# Patient Record
Sex: Male | Born: 1970
Health system: Southern US, Community
[De-identification: ages and names within clinical notes are randomized; demographics above are authoritative.]

## PROBLEM LIST (undated history)

## (undated) DIAGNOSIS — K219 Gastro-esophageal reflux disease without esophagitis: Secondary | ICD-10-CM

## (undated) DIAGNOSIS — K5090 Crohn's disease, unspecified, without complications: Secondary | ICD-10-CM

## (undated) DIAGNOSIS — D229 Melanocytic nevi, unspecified: Secondary | ICD-10-CM

## (undated) DIAGNOSIS — E785 Hyperlipidemia, unspecified: Secondary | ICD-10-CM

## (undated) DIAGNOSIS — I10 Essential (primary) hypertension: Secondary | ICD-10-CM

## (undated) HISTORY — PX: OTHER SURGICAL HISTORY: SHX169

## (undated) HISTORY — PX: TONSILLECTOMY: SUR1361

## (undated) HISTORY — PX: ESOPHAGOGASTRODUODENOSCOPY: SHX1529

---
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
## 2007-11-22 ENCOUNTER — Ambulatory Visit: Payer: Self-pay | Admitting: Gastroenterology

## 2007-12-10 ENCOUNTER — Ambulatory Visit: Payer: Self-pay | Admitting: Gastroenterology

## 2008-06-16 ENCOUNTER — Ambulatory Visit: Payer: Self-pay | Admitting: Gastroenterology

## 2009-09-17 ENCOUNTER — Ambulatory Visit: Payer: Self-pay | Admitting: Gastroenterology

## 2009-10-29 ENCOUNTER — Ambulatory Visit: Payer: Self-pay | Admitting: Gastroenterology

## 2009-12-06 ENCOUNTER — Other Ambulatory Visit: Payer: Self-pay | Admitting: Gastroenterology

## 2010-01-10 ENCOUNTER — Ambulatory Visit: Payer: Self-pay | Admitting: Gastroenterology

## 2010-03-28 ENCOUNTER — Other Ambulatory Visit: Payer: Self-pay | Admitting: Nurse Practitioner

## 2010-10-27 ENCOUNTER — Ambulatory Visit: Payer: Self-pay | Admitting: Gastroenterology

## 2010-10-31 LAB — PATHOLOGY REPORT

## 2011-03-17 ENCOUNTER — Ambulatory Visit: Payer: Self-pay | Admitting: Gastroenterology

## 2011-07-19 ENCOUNTER — Other Ambulatory Visit: Payer: Self-pay | Admitting: Gastroenterology

## 2011-08-10 ENCOUNTER — Other Ambulatory Visit: Payer: Self-pay | Admitting: Gastroenterology

## 2011-09-20 ENCOUNTER — Ambulatory Visit: Payer: Self-pay | Admitting: Gastroenterology

## 2011-09-27 ENCOUNTER — Ambulatory Visit: Payer: Self-pay | Admitting: Gastroenterology

## 2011-10-24 ENCOUNTER — Other Ambulatory Visit: Payer: Self-pay | Admitting: Gastroenterology

## 2011-12-13 ENCOUNTER — Other Ambulatory Visit: Payer: Self-pay | Admitting: Gastroenterology

## 2011-12-21 ENCOUNTER — Other Ambulatory Visit: Payer: Self-pay | Admitting: Gastroenterology

## 2012-02-16 ENCOUNTER — Other Ambulatory Visit: Payer: Self-pay | Admitting: Gastroenterology

## 2012-02-16 LAB — CLOSTRIDIUM DIFFICILE BY PCR

## 2012-09-27 ENCOUNTER — Emergency Department: Payer: Self-pay | Admitting: Emergency Medicine

## 2012-09-27 LAB — APTT: Activated PTT: 31.7 secs (ref 23.6–35.9)

## 2012-09-27 LAB — LIPASE, BLOOD: Lipase: 161 U/L (ref 73–393)

## 2012-09-27 LAB — PROTIME-INR: INR: 0.9

## 2013-11-26 IMAGING — CR DG CHEST 2V
1 series · 2 of 2 positions shown · non-contrast
Comparison: none

REASON FOR EXAM: cough
COMMENTS:

[Series 1: w chest pa · 0.14mm/px · 2 of 2 slices shown]
[im 1/2]
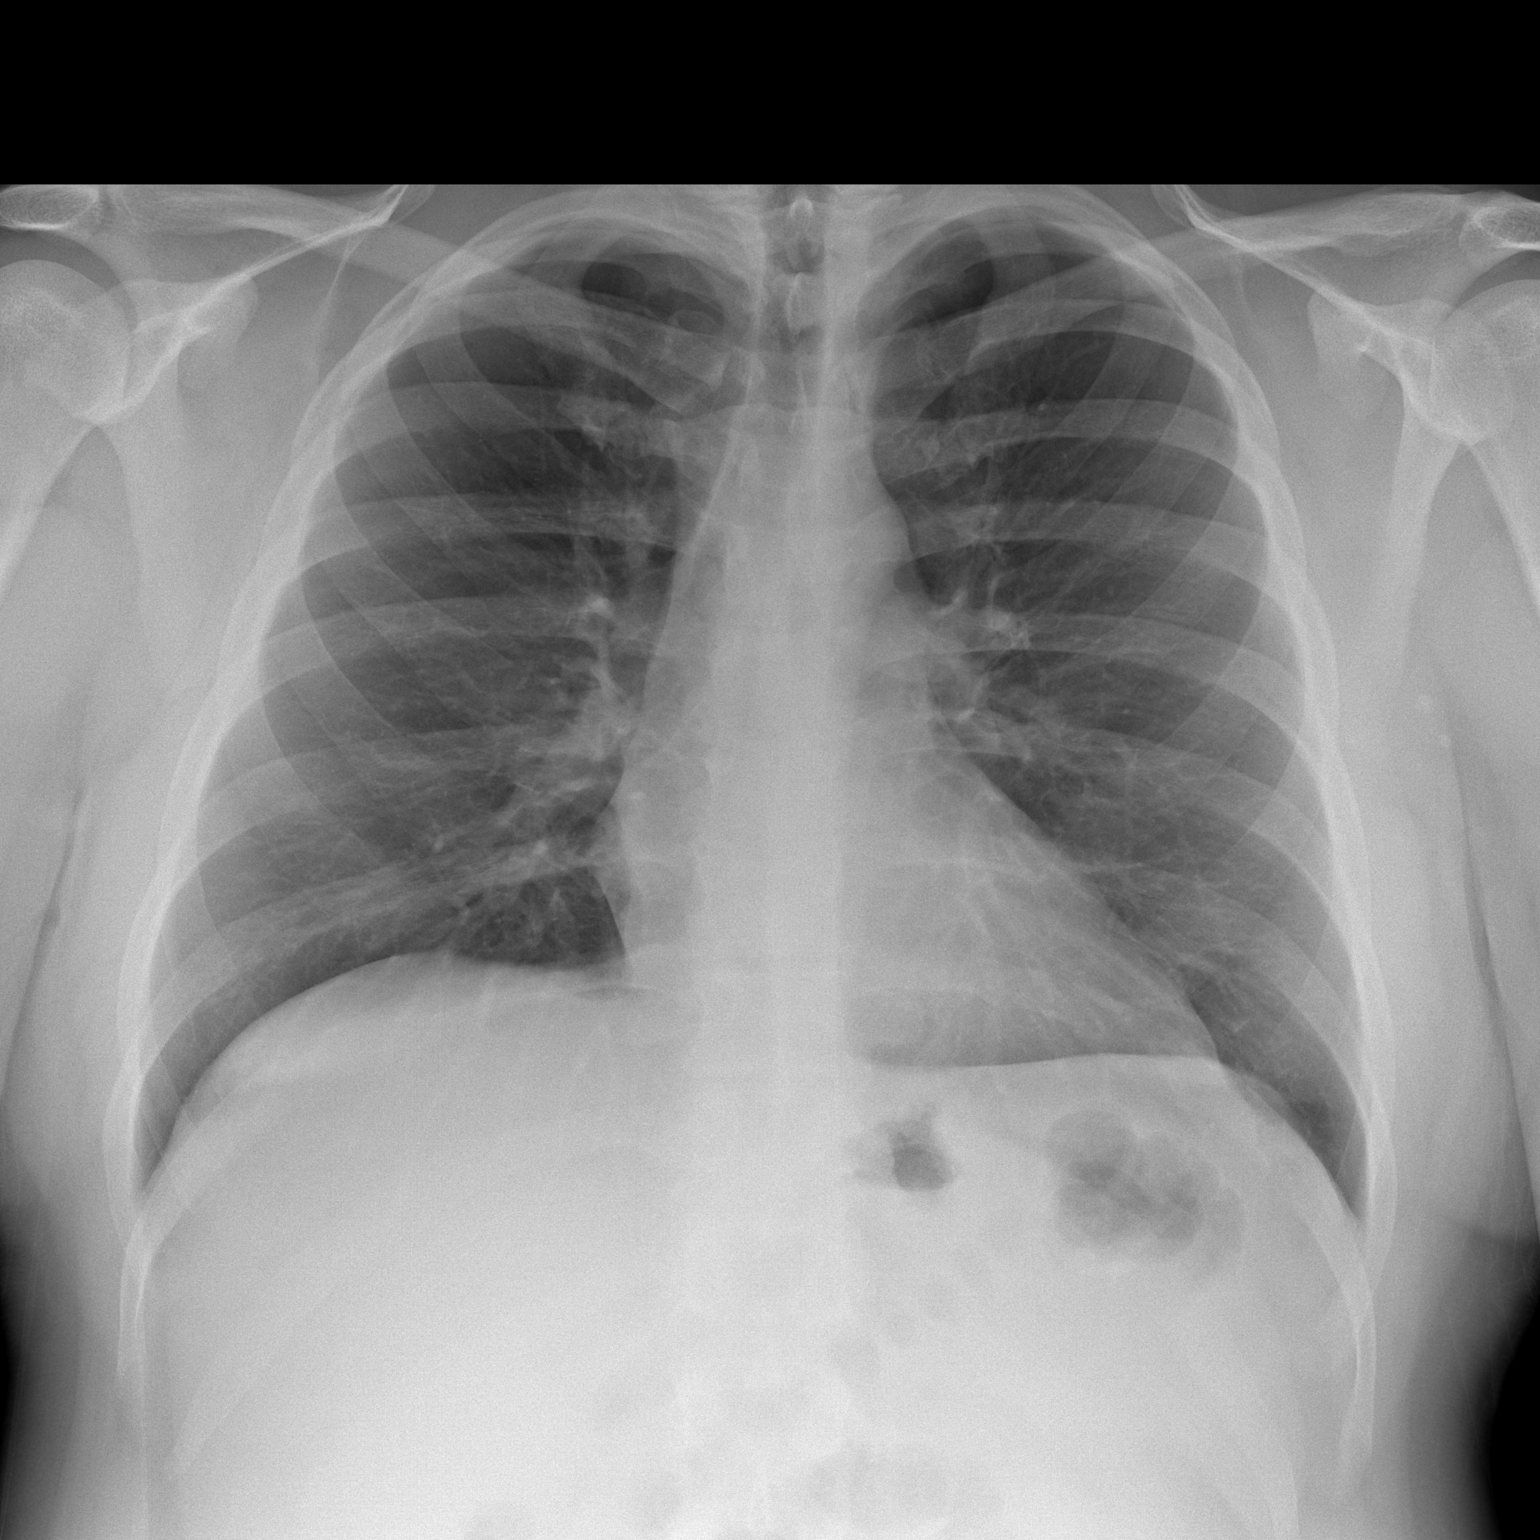
[im 2/2]
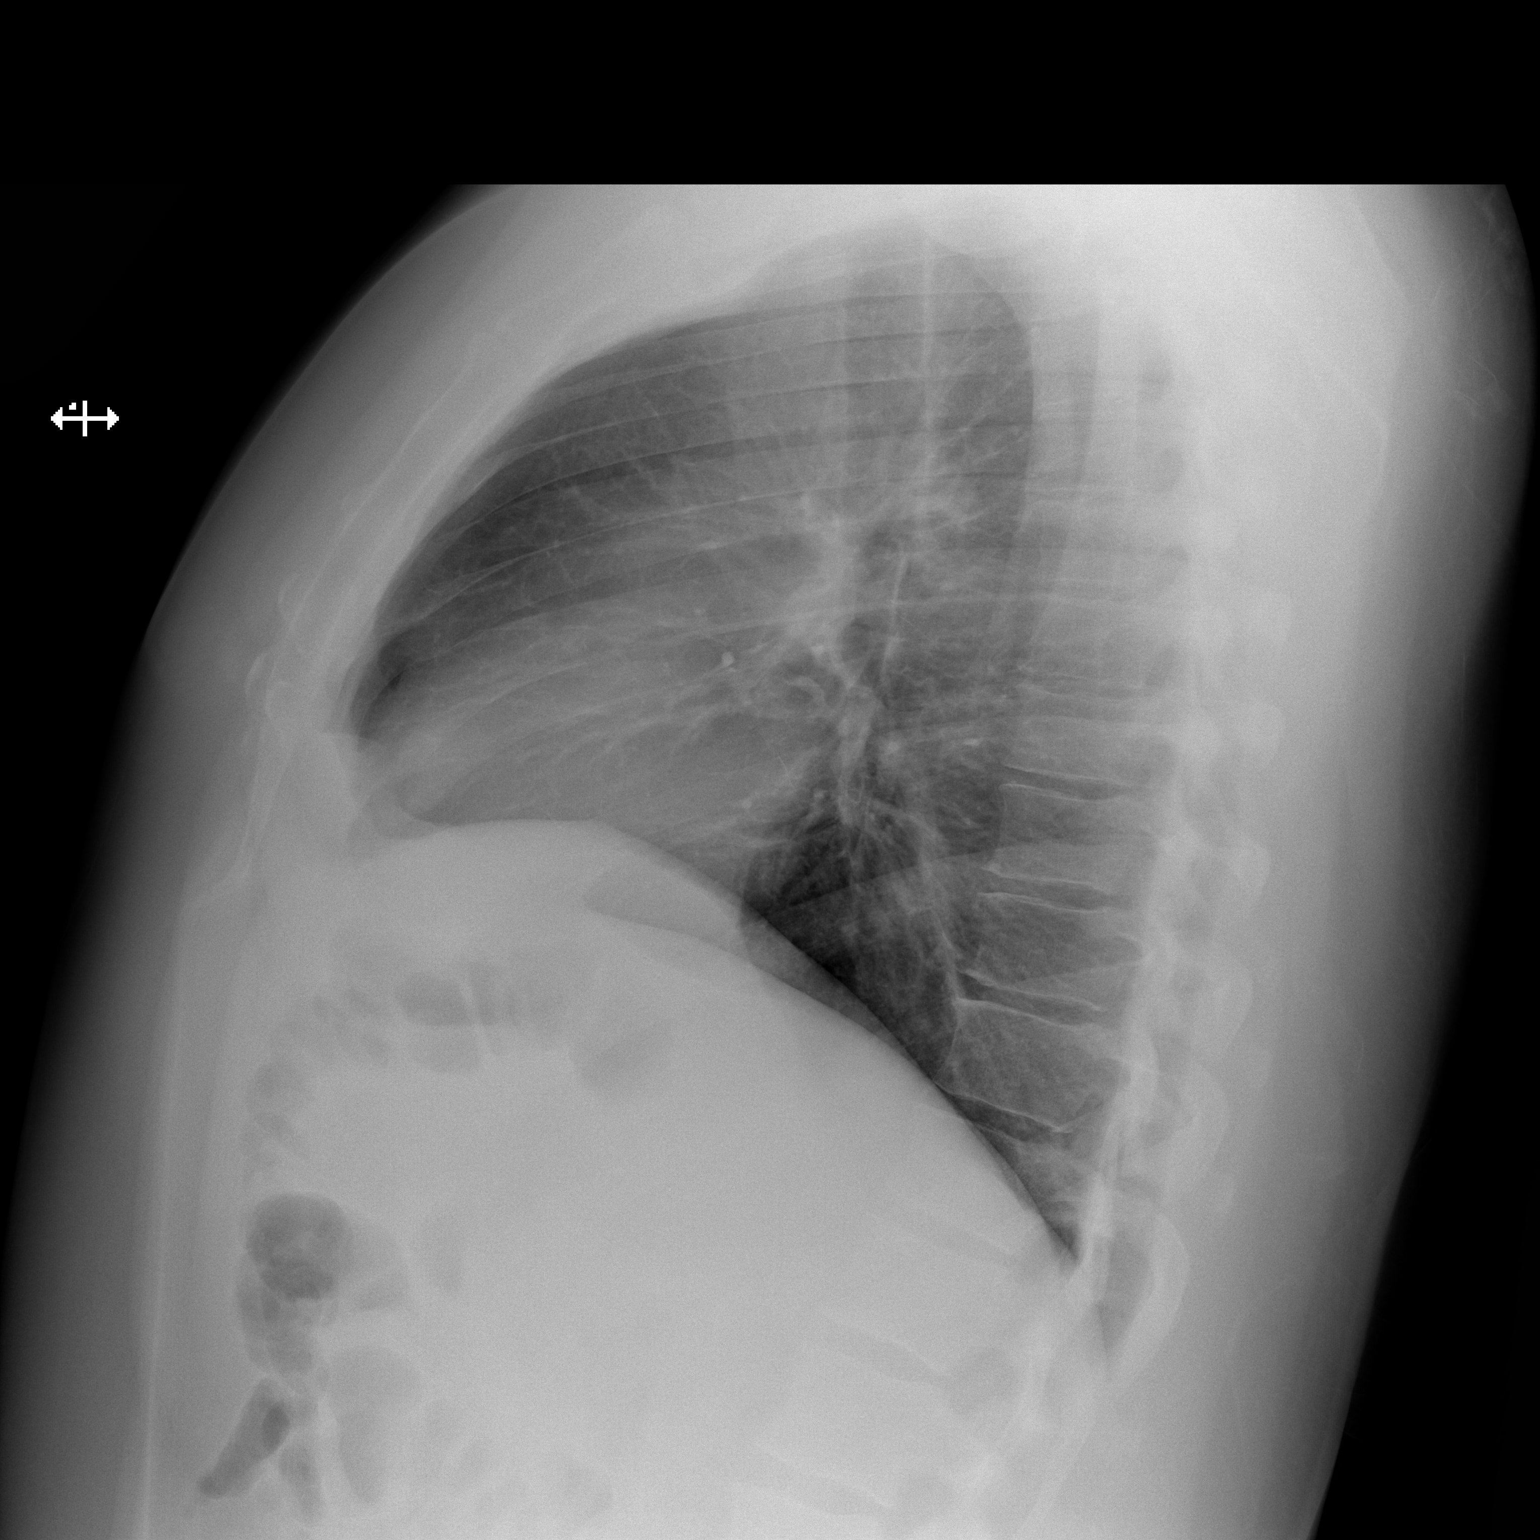

[2 of 2 positions shown; findings below may reference images not displayed]

PROCEDURE:     DXR - DXR CHEST PA (OR AP) AND LATERAL  - September 27, 2012 [DATE]

RESULT:     There is no previous exam for comparison.

The lungs are clear. The heart and pulmonary vessels are normal. The bony
and mediastinal structures are unremarkable. There is no effusion. There is
no pneumothorax or evidence of congestive failure.
IMPRESSION: No acute cardiopulmonary disease.

[REDACTED]

## 2014-06-02 DIAGNOSIS — K219 Gastro-esophageal reflux disease without esophagitis: Secondary | ICD-10-CM | POA: Insufficient documentation

## 2014-06-02 DIAGNOSIS — K5090 Crohn's disease, unspecified, without complications: Secondary | ICD-10-CM | POA: Insufficient documentation

## 2014-07-03 ENCOUNTER — Ambulatory Visit: Payer: Self-pay | Admitting: Gastroenterology

## 2014-07-08 LAB — PATHOLOGY REPORT

## 2015-04-06 NOTE — Consult Note (Signed)
PATIENT NAME:  Todd Shepherd, FLUEGEL MR#:  [id number] DATE OF BIRTH:  11-Nov-1971  DATE OF CONSULTATION:  09/27/2012  REFERRING PHYSICIAN:   CONSULTING PHYSICIAN: Loistine Simas, MD / Theodore Demark, NP  PRIMARY CARE PHYSICIAN: Ramonita Lab, MD  HISTORY OF PRESENT ILLNESS: [name] is a very pleasant 44 year old Caucasian man with a history of Crohn's disease that is currently being treated with Humira 40 mg every week, 6-MP 150 mg p.o. daily, and Entocort 6 mg p.o. daily. Gastroenterology has been consulted at the request of Dr. Loletta Specter, the ED physician, to evaluate elevation of liver transaminases and bilirubin. The patient reports history of intermittent nausea, congestion and cough for the last month. He thought he had possibly picked up an infection from his children. Two weeks ago he saw his primary physician and was started on nasal spray and a Z-Pak without improvement. After a week of therapy, he then began with some nausea and vomiting last Friday as well as fatigue. This persisted throughout the weekend. He phoned his primary physician on Monday who reportedly prescribed him some Levaquin and some Phenergan for his symptoms. He did continue with the nausea and vomiting, noticed his urine turned dark on Tuesday, and both he and his wife report that he developed some jaundice on Wednesday to his eyes and his torso. States his last dose of Levaquin was yesterday. Today he is feeling much better, says his urine is lightening up, his appetite has come back, and he is very hungry. He does deny abdominal pain. States his Crohn's disease and bowel movements have never been better. Denies clay dark-colored stools. Denies history of liver illness in the past. States he has had no other recent medication changes. He has not had any recent alcohol and has never been a heavy drinker. He has not been taking any herbs or increased use of Tylenol. He does not take any illicits and there is no family history  of liver disease. He has never had jaundice in the past. He denies ascites, mentation changes, weakness, confusion, drowsiness, problems with bleeding or bruising, or having had viral hepatitis in the past, and additionally with his Humira therapy he has been screened for hepatitis B and was found to be negative. States that he has had no blood or body fluid exposures. Does eat at some restaurants but has not had any seafood. Additionally, he denies diarrhea and further GI complaints. Noted his lab work with normal white blood cell count, normal hemoglobin, normal platelets, and normal PT- INR. Noted the total bilirubin of 9.9 with a direct bilirubin of 6.6, normal lipase, normal creatinine, AST 120, ALT 106, and alkaline phosphatase is normal 84. Albumin is normal at 3.6. Glucose is normal. Potassium is normal. He did have abdominal ultrasound today that did not show any gallstones or abnormalities to the gallbladder. The liver was normal size. There was normal portal venous flow. There was no intrahepatic or extrahepatic ductal dilation. Again, currently he states he is feeling much better today than he has been over the last few days and the last month.   PAST MEDICAL HISTORY:  1. Crohn's disease. 2. Hypertension. 3. Hyperlipidemia. 4. Tonsillectomy and adenoidectomy.  MEDICATIONS: 1. Bystolic 10 mg p.o. daily.  2. Fenofibrate 160 mg p.o. daily.  3. Multivitamin one cap p.o. daily.  4. Humira 40 mg sub-Q weekly. 5. Protonix 40 mg 1 tab p.o. daily.  6. 6-MP 150 mg p.o. daily.  7. Budesonide 6 mg p.o. daily.  ALLERGIES: Amoxicillin.  LABS AND IMAGING STUDIES: As noted above.   FAMILY HISTORY: Negative for colorectal cancer, liver disease, inflammatory bowel disease, and peptic ulcer disease. Pertinent for diabetes and rheumatoid arthritis.   SOCIAL HISTORY: Stopped smoking in 2008. No EtOH or illicits. Never had a blood transfusion. Lives with family and is in a good relationship.  REVIEW  OF SYSTEMS: Ten systems reviewed. Unremarkable other than that which is noted above.   PHYSICAL EXAMINATION:   VITAL SIGNS: Most recent vital signs: Temperature 98.3, pulse 73, blood pressure 116/56, oxygen saturation 96% on room air, and respiratory rate 18.   GENERAL: Well-appearing Caucasian man lying in bed in no acute distress, appears comfortable.   HEENT: Normocephalic, atraumatic. There is some mild icterus to the sclerae. No redness, drainage, or inflammation to the eyes or the nares. Oral mucous membranes are pink and moist.   NECK: No abnormalities.   PULMONARY: Respirations symmetrical and equal without increased work of breathing. Lungs CTAB.   CARDIOVASCULAR: S1 and S2, regular rate and rhythm. No MRG. No appreciable edema.   ABDOMEN: Protuberant, nondistended. Bowel sounds x4. Very soft. No tenderness, no distention, no ascites, no guarding or rigidity, no peritoneal signs, and no hepatosplenomegaly, bruits, hernias, or other abnormalities.   RECTAL: Deferred.   GENITOURINARY: Deferred.   EXTREMITIES: Moves all extremities x4. Strength 5/5. Sensation appears intact. No clubbing or cyanosis. No bruising. No petechiae. No erythema, lesion or rash.   SKIN: Warm and dry with minimal jaundice.  NEUROLOGICAL: Alert and oriented x3. Cranial nerves II through XII grossly intact. Speech clear. No facial droop.   PSYCHIATRIC: Pleasant, cooperative, good memory skills.   IMPRESSION AND PLAN: Mild jaundice, elevated bilirubin, and slight transaminitis. Questionably drug reaction, less likely viral episode, feeling better, has improved. No alarm signs are present such as altered mentation, abdominal pain, or bleeding. With the exception of his bilirubin, AST, and ALT his lab work.  I have discussed him with Dr. Gustavo Lah  and since he is feeling much better at present we will not change anything and we will let him go home from the Emergency Department on the condition that if he feels  worse over the weekend he should return and he should also additionally schedule an appointment in the clinic to be seen Monday morning and we will do further work-up from there. He should also avoid any additional substances that may affect his liver.   These services were provided by Stephens November, MSN, [place] in collaboration with Loistine Simas, MD.   ____________________________ Theodore Demark, NP chl:slb D: 09/27/2012 15:17:02 ET T: 09/27/2012 15:39:10 ET JOB#: [id number]  cc: Theodore Demark, NP, <Dictator> [place] SIGNED 10/05/2012 7:47

## 2016-11-03 DIAGNOSIS — D229 Melanocytic nevi, unspecified: Secondary | ICD-10-CM

## 2016-11-03 HISTORY — DX: Melanocytic nevi, unspecified: D22.9

## 2017-01-11 DIAGNOSIS — K5090 Crohn's disease, unspecified, without complications: Secondary | ICD-10-CM | POA: Diagnosis not present

## 2017-01-11 DIAGNOSIS — Z125 Encounter for screening for malignant neoplasm of prostate: Secondary | ICD-10-CM | POA: Diagnosis not present

## 2017-01-11 DIAGNOSIS — I10 Essential (primary) hypertension: Secondary | ICD-10-CM | POA: Diagnosis not present

## 2017-01-18 DIAGNOSIS — Z0001 Encounter for general adult medical examination with abnormal findings: Secondary | ICD-10-CM | POA: Diagnosis not present

## 2017-01-18 DIAGNOSIS — K219 Gastro-esophageal reflux disease without esophagitis: Secondary | ICD-10-CM | POA: Diagnosis not present

## 2017-01-18 DIAGNOSIS — I10 Essential (primary) hypertension: Secondary | ICD-10-CM | POA: Diagnosis not present

## 2017-02-19 DIAGNOSIS — D229 Melanocytic nevi, unspecified: Secondary | ICD-10-CM | POA: Diagnosis not present

## 2017-02-19 DIAGNOSIS — L570 Actinic keratosis: Secondary | ICD-10-CM | POA: Diagnosis not present

## 2017-02-20 DIAGNOSIS — I10 Essential (primary) hypertension: Secondary | ICD-10-CM | POA: Diagnosis not present

## 2017-05-21 DIAGNOSIS — D485 Neoplasm of uncertain behavior of skin: Secondary | ICD-10-CM | POA: Diagnosis not present

## 2017-06-05 DIAGNOSIS — K5010 Crohn's disease of large intestine without complications: Secondary | ICD-10-CM | POA: Diagnosis not present

## 2017-06-05 DIAGNOSIS — Z86010 Personal history of colonic polyps: Secondary | ICD-10-CM | POA: Diagnosis not present

## 2017-06-29 ENCOUNTER — Encounter: Payer: Self-pay | Admitting: *Deleted

## 2017-07-02 ENCOUNTER — Ambulatory Visit: Payer: 59 | Admitting: Anesthesiology

## 2017-07-02 ENCOUNTER — Ambulatory Visit
Admission: RE | Admit: 2017-07-02 | Discharge: 2017-07-02 | Disposition: A | Discharge status: Home / Self Care | Payer: 59 | Source: Ambulatory Visit | Attending: Gastroenterology | Admitting: Gastroenterology

## 2017-07-02 ENCOUNTER — Encounter
Admission: RE | Disposition: A | Discharge status: Home / Self Care | Payer: Self-pay | Source: Ambulatory Visit | Attending: Gastroenterology

## 2017-07-02 DIAGNOSIS — Z8719 Personal history of other diseases of the digestive system: Secondary | ICD-10-CM | POA: Insufficient documentation

## 2017-07-02 DIAGNOSIS — D120 Benign neoplasm of cecum: Secondary | ICD-10-CM | POA: Insufficient documentation

## 2017-07-02 DIAGNOSIS — K5190 Ulcerative colitis, unspecified, without complications: Secondary | ICD-10-CM | POA: Insufficient documentation

## 2017-07-02 DIAGNOSIS — I10 Essential (primary) hypertension: Secondary | ICD-10-CM | POA: Insufficient documentation

## 2017-07-02 DIAGNOSIS — Z79899 Other long term (current) drug therapy: Secondary | ICD-10-CM | POA: Diagnosis not present

## 2017-07-02 DIAGNOSIS — Z87891 Personal history of nicotine dependence: Secondary | ICD-10-CM | POA: Insufficient documentation

## 2017-07-02 DIAGNOSIS — K219 Gastro-esophageal reflux disease without esophagitis: Secondary | ICD-10-CM | POA: Diagnosis not present

## 2017-07-02 DIAGNOSIS — K5289 Other specified noninfective gastroenteritis and colitis: Secondary | ICD-10-CM | POA: Diagnosis not present

## 2017-07-02 DIAGNOSIS — K5090 Crohn's disease, unspecified, without complications: Secondary | ICD-10-CM | POA: Diagnosis present

## 2017-07-02 DIAGNOSIS — K633 Ulcer of intestine: Secondary | ICD-10-CM | POA: Diagnosis not present

## 2017-07-02 DIAGNOSIS — K529 Noninfective gastroenteritis and colitis, unspecified: Secondary | ICD-10-CM | POA: Insufficient documentation

## 2017-07-02 DIAGNOSIS — K5790 Diverticulosis of intestine, part unspecified, without perforation or abscess without bleeding: Secondary | ICD-10-CM | POA: Diagnosis not present

## 2017-07-02 DIAGNOSIS — K5010 Crohn's disease of large intestine without complications: Secondary | ICD-10-CM | POA: Diagnosis not present

## 2017-07-02 DIAGNOSIS — Z86010 Personal history of colonic polyps: Secondary | ICD-10-CM | POA: Diagnosis not present

## 2017-07-02 DIAGNOSIS — K635 Polyp of colon: Secondary | ICD-10-CM | POA: Diagnosis not present

## 2017-07-02 HISTORY — DX: Crohn's disease, unspecified, without complications: K50.90

## 2017-07-02 HISTORY — DX: Essential (primary) hypertension: I10

## 2017-07-02 HISTORY — DX: Gastro-esophageal reflux disease without esophagitis: K21.9

## 2017-07-02 HISTORY — PX: COLONOSCOPY WITH PROPOFOL: SHX5780

## 2017-07-02 HISTORY — DX: Hyperlipidemia, unspecified: E78.5

## 2017-07-02 SURGERY — COLONOSCOPY WITH PROPOFOL
Anesthesia: General

## 2017-07-02 MED ORDER — PROPOFOL 10 MG/ML IV BOLUS
INTRAVENOUS | Status: DC | PRN
  Administered 2017-07-02: 70 mg via INTRAVENOUS
  Administered 2017-07-02 (×2): 30 mg via INTRAVENOUS

## 2017-07-02 MED ORDER — PROPOFOL 10 MG/ML IV BOLUS
INTRAVENOUS | Status: AC
  Filled 2017-07-02: qty 20

## 2017-07-02 MED ORDER — EPHEDRINE SULFATE 50 MG/ML IJ SOLN
INTRAMUSCULAR | Status: AC
  Filled 2017-07-02: qty 1

## 2017-07-02 MED ORDER — PROPOFOL 500 MG/50ML IV EMUL
INTRAVENOUS | Status: AC
  Filled 2017-07-02: qty 50

## 2017-07-02 MED ORDER — SODIUM CHLORIDE 0.9 % IV SOLN
INTRAVENOUS | Status: DC
  Administered 2017-07-02: 07:00:00 via INTRAVENOUS

## 2017-07-02 MED ORDER — SODIUM CHLORIDE 0.9 % IV SOLN: INTRAVENOUS | Status: DC

## 2017-07-02 MED ORDER — PROPOFOL 500 MG/50ML IV EMUL
INTRAVENOUS | Status: DC | PRN
  Administered 2017-07-02: 150 ug/kg/min via INTRAVENOUS

## 2017-07-02 MED ORDER — GLYCOPYRROLATE 0.2 MG/ML IJ SOLN
INTRAMUSCULAR | Status: AC
  Filled 2017-07-02: qty 1

## 2017-07-02 MED ORDER — LIDOCAINE HCL (CARDIAC) 20 MG/ML IV SOLN
INTRAVENOUS | Status: DC | PRN
  Administered 2017-07-02: 100 mg via INTRAVENOUS

## 2017-07-02 MED ORDER — SUCCINYLCHOLINE CHLORIDE 20 MG/ML IJ SOLN
INTRAMUSCULAR | Status: AC
  Filled 2017-07-02: qty 1

## 2017-07-02 MED ORDER — PHENYLEPHRINE HCL 10 MG/ML IJ SOLN
INTRAMUSCULAR | Status: AC
  Filled 2017-07-02: qty 1

## 2017-07-02 MED ORDER — LIDOCAINE HCL (PF) 2 % IJ SOLN
INTRAMUSCULAR | Status: AC
  Filled 2017-07-02: qty 2

## 2017-07-02 NOTE — Anesthesia Post-op Follow-up Note (Cosign Needed)
Anesthesia QCDR form completed.        

## 2017-07-02 NOTE — Transfer of Care (Signed)
Immediate Anesthesia Transfer of Care Note  Patient: [name]  Procedure(s) Performed: Procedure(s): COLONOSCOPY WITH PROPOFOL (N/A)  Patient Location: PACU  Anesthesia Type:General  Level of Consciousness: awake, alert  and oriented  Airway & Oxygen Therapy: Patient Spontanous Breathing and Patient connected to nasal cannula oxygen  Post-op Assessment: Report given to RN and Post -op Vital signs reviewed and stable  Post vital signs: Reviewed and stable  Last Vitals:  Vitals:   07/02/17 0710 07/02/17 0813  BP: 125/69 90/70  Pulse: 63 71  Resp:  18  Temp: (!) 35.7 C (!) 36.2 C    Last Pain:  Vitals:   07/02/17 0813  TempSrc: Tympanic         Complications: No apparent anesthesia complications

## 2017-07-02 NOTE — Anesthesia Preprocedure Evaluation (Signed)
Anesthesia Evaluation  Patient identified by MRN, date of birth, ID band Patient awake    Reviewed: Allergy & Precautions, NPO status , Patient's Chart, lab work & pertinent test results, reviewed documented beta blocker date and time   Airway Mallampati: II  TM Distance: >3 FB     Dental  (+) Chipped   Pulmonary former smoker,           Cardiovascular hypertension, Pt. on medications      Neuro/Psych    GI/Hepatic GERD  ,  Endo/Other    Renal/GU      Musculoskeletal   Abdominal   Peds  Hematology   Anesthesia Other Findings   Reproductive/Obstetrics                             Anesthesia Physical Anesthesia Plan  ASA: II  Anesthesia Plan: General   Post-op Pain Management:    Induction: Intravenous  PONV Risk Score and Plan:   Airway Management Planned:   Additional Equipment:   Intra-op Plan:   Post-operative Plan:   Informed Consent: I have reviewed the patients History and Physical, chart, labs and discussed the procedure including the risks, benefits and alternatives for the proposed anesthesia with the patient or authorized representative who has indicated his/her understanding and acceptance.     Plan Discussed with: CRNA  Anesthesia Plan Comments:         Anesthesia Quick Evaluation

## 2017-07-02 NOTE — Anesthesia Postprocedure Evaluation (Signed)
Anesthesia Post Note  Patient: Todd Shepherd  Procedure(s) Performed: Procedure(s) (LRB): COLONOSCOPY WITH PROPOFOL (N/A)  Patient location during evaluation: Endoscopy Anesthesia Type: General Level of consciousness: awake and alert Pain management: pain level controlled Vital Signs Assessment: post-procedure vital signs reviewed and stable Respiratory status: spontaneous breathing, nonlabored ventilation, respiratory function stable and patient connected to nasal cannula oxygen Cardiovascular status: blood pressure returned to baseline and stable Postop Assessment: no signs of nausea or vomiting Anesthetic complications: no     Last Vitals:  Vitals:   07/02/17 0833 07/02/17 0843  BP: 121/79 124/77  Pulse: 69 (!) 55  Resp: 19 (!) 6  Temp:      Last Pain:  Vitals:   07/02/17 0813  TempSrc: Tympanic                 Charina Fons S

## 2017-07-02 NOTE — H&P (Signed)
Outpatient short stay form Pre-procedure 07/02/2017 7:33 AM Lollie Sails MD  Primary Physician: Dr. Ramonita Lab  Reason for visit:  Colonoscopy  History of present illness:  Patient is a 46 year old male with a personal history of Crohn's disease. He is been doing quite well over the past several years with Humira weekly and moderate dose of Entocort. He tolerated his prep well. He takes no aspirin or blood thinning agents. He is presenting for follow-up.    Current Facility-Administered Medications:  .  0.9 %  sodium chloride infusion, , Intravenous, Continuous, Lollie Sails, MD, Last Rate: 20 mL/hr at 07/02/17 0723 .  0.9 %  sodium chloride infusion, , Intravenous, Continuous, Lollie Sails, MD  Prescriptions Prior to Admission  Medication Sig Dispense Refill Last Dose  . ACIDOPHILUS LACTOBACILLUS PO Take by mouth.   Past Week at Unknown time  . Adalimumab (HUMIRA PEN) 40 MG/0.8ML PNKT Inject into the skin.   Past Week at Unknown time  . budesonide (ENTOCORT EC) 3 MG 24 hr capsule Take by mouth daily.   Past Week at Unknown time  . Multiple Vitamin (MULTIVITAMIN) tablet Take 1 tablet by mouth daily.   Past Week at Unknown time  . nebivolol (BYSTOLIC) 5 MG tablet Take 5 mg by mouth daily.   07/02/2017 at Unknown time  . Omega-3 Fatty Acids (OMEGA-3 FISH OIL PO) Take by mouth.   Past Week at Unknown time  . pantoprazole (PROTONIX) 40 MG tablet Take 40 mg by mouth daily.   Past Week at Unknown time     Allergies  Allergen Reactions  . Amoxicillin   . Levaquin [Levofloxacin]   . Mercaptopurine      Past Medical History:  Diagnosis Date  . Benign hypertension   . Crohn's disease ([place])   . GERD (gastroesophageal reflux disease)   . Hyperlipidemia     Review of systems:      Physical Exam    Heart and lungs: Regular rate and rhythm without rub or gallop, lungs are bilaterally clear    HEENT: Norm cephalic atraumatic eyes are anicteric    Other:    Pertinant exam for procedure: Soft nontender nondistended bowel sounds positive normoactive.    Planned proceedures: Colonoscopy and indicated procedures. I have discussed the risks benefits and complications of procedures to include not limited to bleeding, infection, perforation and the risk of sedation and the patient wishes to proceed.    Lollie Sails, MD Gastroenterology 07/02/2017  7:33 AM

## 2017-07-02 NOTE — Op Note (Signed)
[facility] Gastroenterology Patient Name: Todd Shepherd Procedure Date: 07/02/2017 7:34 AM MRN: [id number] Account #: [id number] Date of Birth: May 15, 1971 Admit Type: Outpatient Age: 46 Room: [facility] ENDO ROOM 1 Gender: Male Note Status: Finalized Procedure:            Colonoscopy Indications:          Personal history of Crohn's disease Providers:            Lollie Sails, MD Referring MD:         Ramonita Lab, MD (Referring MD) Medicines:            Monitored Anesthesia Care Complications:        No immediate complications. Procedure:            Pre-Anesthesia Assessment:                       - ASA Grade Assessment: II - A patient with mild                        systemic disease.                       After obtaining informed consent, the colonoscope was                        passed under direct vision. Throughout the procedure,                        the patient's blood pressure, pulse, and oxygen                        saturations were monitored continuously. The                        Colonoscope was introduced through the anus and                        advanced to the the cecum, identified by appendiceal                        orifice and ileocecal valve. The colonoscopy was                        performed without difficulty. The patient tolerated the                        procedure well. The quality of the bowel preparation                        was good. Findings:      A less than 1 mm polyp was found in the cecum. The polyp was sessile.       The polyp was removed with a cold biopsy forceps. Resection and       retrieval were complete.      The terminal ileum contained multiple one mm ulcers. No bleeding was       present. No stigmata of recent bleeding were seen. Biopsies were taken       with a cold forceps for histology. The mucosa between the ulcers was       normal in appearance, with an ulcer on the  opening of the IC valve .       Biopsies  of these were taken sepatately. The opening of the IC valve was       open, not strictured or stenosed.      The exam was otherwise without abnormality.      Biopsies for histology were taken with a cold forceps from the cecum,       ascending colon, transverse colon, descending colon, sigmoid colon and       rectum for evaluation of microscopic colitis.      The retroflexed view of the distal rectum and anal verge was normal and       showed no anal or rectal abnormalities.      The digital rectal exam was normal. Impression:           - One less than 1 mm polyp in the cecum, removed with a                        cold biopsy forceps. Resected and retrieved.                       - Multiple ulcers in the terminal ileum. Biopsied.                       - The examination was otherwise normal.                       - The distal rectum and anal verge are normal on                        retroflexion view.                       - Biopsies were taken with a cold forceps from the                        cecum, ascending colon, transverse colon, descending                        colon, sigmoid colon and rectum for evaluation of                        microscopic colitis. Recommendation:       - Discharge patient to home.                       - Await pathology results.                       - Continue present medications.                       - Return to GI clinic in 1 month. Procedure Code(s):    --- Professional ---                       [phone], Colonoscopy, flexible; with biopsy, single or                        multiple Diagnosis Code(s):    --- Professional ---  D12.0, Benign neoplasm of cecum                       K63.3, Ulcer of intestine                       Z87.19, Personal history of other diseases of the                        digestive system CPT copyright 2016 [employer]. All rights reserved. The codes documented in this report are  preliminary and upon coder review may  be revised to meet current compliance requirements. Lollie Sails, MD 07/02/2017 8:12:57 AM This report has been signed electronically. Number of Addenda: 0 Note Initiated On: 07/02/2017 7:34 AM Scope Withdrawal Time: 0 hours 16 minutes 47 seconds  Total Procedure Duration: 0 hours 22 minutes 53 seconds       [facility]

## 2017-07-02 NOTE — Anesthesia Procedure Notes (Signed)
Date/Time: 07/02/2017 7:34 AM Performed by: Darlyne Russian Pre-anesthesia Checklist: Patient identified, Emergency Drugs available, Suction available, Patient being monitored and Timeout performed Patient Re-evaluated:Patient Re-evaluated prior to induction Oxygen Delivery Method: Nasal cannula Placement Confirmation: positive ETCO2

## 2017-07-03 ENCOUNTER — Encounter: Payer: Self-pay | Admitting: Gastroenterology

## 2017-07-03 LAB — SURGICAL PATHOLOGY

## 2017-07-12 DIAGNOSIS — K219 Gastro-esophageal reflux disease without esophagitis: Secondary | ICD-10-CM | POA: Diagnosis not present

## 2017-07-12 DIAGNOSIS — I10 Essential (primary) hypertension: Secondary | ICD-10-CM | POA: Diagnosis not present

## 2017-07-12 DIAGNOSIS — K5090 Crohn's disease, unspecified, without complications: Secondary | ICD-10-CM | POA: Diagnosis not present

## 2017-07-23 DIAGNOSIS — D751 Secondary polycythemia: Secondary | ICD-10-CM | POA: Diagnosis not present

## 2017-07-23 DIAGNOSIS — I10 Essential (primary) hypertension: Secondary | ICD-10-CM | POA: Diagnosis not present

## 2017-07-23 DIAGNOSIS — K5090 Crohn's disease, unspecified, without complications: Secondary | ICD-10-CM | POA: Diagnosis not present

## 2017-07-23 DIAGNOSIS — K219 Gastro-esophageal reflux disease without esophagitis: Secondary | ICD-10-CM | POA: Diagnosis not present

## 2017-08-28 DIAGNOSIS — K50919 Crohn's disease, unspecified, with unspecified complications: Secondary | ICD-10-CM | POA: Diagnosis not present

## 2017-09-17 ENCOUNTER — Inpatient Hospital Stay: Payer: 59

## 2017-09-17 ENCOUNTER — Inpatient Hospital Stay: Payer: 59 | Attending: Oncology | Admitting: Oncology

## 2017-09-17 ENCOUNTER — Encounter: Payer: Self-pay | Admitting: Oncology

## 2017-09-17 VITALS — BP 131/83 | HR 80 | Temp 97.3°F | Wt 222.4 lb

## 2017-09-17 DIAGNOSIS — D729 Disorder of white blood cells, unspecified: Secondary | ICD-10-CM

## 2017-09-17 DIAGNOSIS — Z87891 Personal history of nicotine dependence: Secondary | ICD-10-CM | POA: Diagnosis not present

## 2017-09-17 DIAGNOSIS — K5090 Crohn's disease, unspecified, without complications: Secondary | ICD-10-CM | POA: Diagnosis not present

## 2017-09-17 DIAGNOSIS — R7989 Other specified abnormal findings of blood chemistry: Secondary | ICD-10-CM | POA: Diagnosis not present

## 2017-09-17 DIAGNOSIS — Z79899 Other long term (current) drug therapy: Secondary | ICD-10-CM

## 2017-09-17 DIAGNOSIS — E785 Hyperlipidemia, unspecified: Secondary | ICD-10-CM | POA: Diagnosis not present

## 2017-09-17 DIAGNOSIS — Z8711 Personal history of peptic ulcer disease: Secondary | ICD-10-CM

## 2017-09-17 DIAGNOSIS — K219 Gastro-esophageal reflux disease without esophagitis: Secondary | ICD-10-CM | POA: Diagnosis not present

## 2017-09-17 DIAGNOSIS — Z7952 Long term (current) use of systemic steroids: Secondary | ICD-10-CM

## 2017-09-17 DIAGNOSIS — D751 Secondary polycythemia: Secondary | ICD-10-CM | POA: Diagnosis not present

## 2017-09-17 DIAGNOSIS — D72829 Elevated white blood cell count, unspecified: Secondary | ICD-10-CM

## 2017-09-17 DIAGNOSIS — D72824 Basophilia: Secondary | ICD-10-CM

## 2017-09-17 DIAGNOSIS — I10 Essential (primary) hypertension: Secondary | ICD-10-CM

## 2017-09-17 LAB — CBC WITH DIFFERENTIAL/PLATELET
BASOS PCT: 1 %
Basophils Absolute: 0.1 10*3/uL (ref 0–0.1)
EOS ABS: 0.3 10*3/uL (ref 0–0.7)
Eosinophils Relative: 3 %
HEMATOCRIT: 52.7 % — AB (ref 40.0–52.0)
Hemoglobin: 18.4 g/dL — ABNORMAL HIGH (ref 13.0–18.0)
Lymphocytes Relative: 25 %
Lymphs Abs: 2.7 10*3/uL (ref 1.0–3.6)
MCH: 32.4 pg (ref 26.0–34.0)
MCHC: 34.9 g/dL (ref 32.0–36.0)
MCV: 93.1 fL (ref 80.0–100.0)
MONO ABS: 1 10*3/uL (ref 0.2–1.0)
MONOS PCT: 9 %
NEUTROS ABS: 6.8 10*3/uL — AB (ref 1.4–6.5)
Neutrophils Relative %: 62 %
Platelets: 226 10*3/uL (ref 150–440)
RBC: 5.66 MIL/uL (ref 4.40–5.90)
RDW: 13.4 % (ref 11.5–14.5)
WBC: 10.9 10*3/uL — ABNORMAL HIGH (ref 3.8–10.6)

## 2017-09-17 LAB — COMPREHENSIVE METABOLIC PANEL
ALBUMIN: 4.2 g/dL (ref 3.5–5.0)
ALT: 27 U/L (ref 17–63)
AST: 21 U/L (ref 15–41)
Alkaline Phosphatase: 64 U/L (ref 38–126)
Anion gap: 8 (ref 5–15)
BILIRUBIN TOTAL: 0.4 mg/dL (ref 0.3–1.2)
BUN: 14 mg/dL (ref 6–20)
CALCIUM: 9.2 mg/dL (ref 8.9–10.3)
CHLORIDE: 106 mmol/L (ref 101–111)
CO2: 26 mmol/L (ref 22–32)
CREATININE: 0.91 mg/dL (ref 0.61–1.24)
Glucose, Bld: 104 mg/dL — ABNORMAL HIGH (ref 65–99)
Potassium: 4.3 mmol/L (ref 3.5–5.1)
Sodium: 140 mmol/L (ref 135–145)
Total Protein: 7.2 g/dL (ref 6.5–8.1)

## 2017-09-17 NOTE — Progress Notes (Signed)
Hematology/Oncology Consult note [facility] Telephone:([phone] Fax:([phone]  CONSULT NOTE Patient Care Team: [name], MD as PCP - General (Internal Medicine)  REFERRING PROVIDER: Dorian Furnace, Gastroenterology.  CHIEF COMPLAINTS/PURPOSE OF CONSULTATION:  Evaluation of leukocytosis and elevated hemoglobin.   HISTORY OF PRESENTING ILLNESS:  Todd Shepherd 46 y.o.  male with PMH listed as below who was referred by gastroenterology to me for evaluation of persistent leukocytosis and elevated hemoglobin.  Extensive medical records from both [place] were reviewed and HemOnc related problems and patient's complaints are listed as following  1 Crohn's disease: he has been on Humira 76m [place] weekly and Budesonide 657mPO daily. His Surveillance colonoscopy was  done 07/02/17 and demonstrated an adenoma. Per GI, the TI contained some small ulcers without signs of bleeding and mucosa in between ulcers normal. The IC valve was open and not narrowed. It was noted that the exam was otherwise normal. Sveral biopsies were taken: the ileal ulcers were noted to have hte characteristics of healing mucosal injury. There was mild active chronic colitis at the ileocecal opening. The TI, cecum, ascending, tranverse, descending, sigmoid colon, and rectum did not have any colitis/pathologic change. There was a small lymphoid aggregate in the sigmoid colon. There was no dysplasia/malignancy.  2 Persistent leukocytosis: wbc  was 10.8 on 07/12/2017, 12.8 on 07/23/2017 and 13.9 on 08/28/2017, ANC 8.98. Basophil 0.13,   3 Persistent elevated hemoglobin: Hb was 19.4, Hct 55.5 on 07/12/2017, Hb was 12.8 Hct  52.6 on 07/23/2017, Hb was 18.6 and Hct was 52.8 on 08/28/2017  4 Patient reports that he was seen by physician 10-15 years ago due to elevated blood counts and lab work up was done. He can not remember details of his diagnosis. He did not get any phlebotomy  or medication for that condition. He has itchiness, which is chronic, intermittent, usually triggered after hot shower.  Denies any history of blood clot.   ROS:  Review of Systems  Constitutional: Negative.   HENT:  Negative.   Eyes: Negative.   Respiratory: Negative.   Cardiovascular: Negative.   Gastrointestinal: Negative.   Endocrine: Negative.   Genitourinary: Negative.    Musculoskeletal: Negative.   Skin: Positive for itching.  Neurological: Negative.   Hematological: Negative.   Psychiatric/Behavioral: Negative.     MEDICAL HISTORY:  Past Medical History:  Diagnosis Date  . Benign hypertension   . Crohn's disease ([place]  . GERD (gastroesophageal reflux disease)   . Hyperlipidemia     SURGICAL HISTORY: Past Surgical History:  Procedure Laterality Date  . COLONOSCOPY WITH PROPOFOL N/A 07/02/2017   Procedure: COLONOSCOPY WITH PROPOFOL;  Surgeon: SkLollie SailsMD;  [facility];  Service: Endoscopy;  Laterality: N/A;  . colonoscopys     x6  . ESOPHAGOGASTRODUODENOSCOPY    . TONSILLECTOMY      SOCIAL HISTORY: Social History   Social History  . Marital status: Married    Spouse name: N/A  . Number of children: N/A  . Years of education: N/A   Occupational History  . Not on file.   Social History Main Topics  . Smoking status: Former SmResearch scientist (life sciences). Smokeless tobacco: Never Used  . Alcohol use No  . Drug use: No  . Sexual activity: Not on file   Other Topics Concern  . Not on file   Social History Narrative  . No narrative on file    FAMILY HISTORY: Family History  Problem Relation Age of Onset  . Diabetes Mother   . Hypertension Father   . Hypertension Maternal Grandmother   . Diabetes Paternal Grandmother   . Hypertension Paternal Grandmother   . Anemia Paternal Grandfather     ALLERGIES:  is allergic to amoxicillin; levaquin [levofloxacin]; and mercaptopurine.  MEDICATIONS:  Current Outpatient Prescriptions  Medication  Sig Dispense Refill  . ACIDOPHILUS LACTOBACILLUS PO Take by mouth.    . Adalimumab (HUMIRA PEN) 40 MG/0.8ML PNKT Inject into the skin.    . budesonide (ENTOCORT EC) 3 MG 24 hr capsule Take by mouth daily.    . Multiple Vitamin (MULTIVITAMIN) tablet Take 1 tablet by mouth daily.    . nebivolol (BYSTOLIC) 5 MG tablet Take 5 mg by mouth daily.    . Omega-3 Fatty Acids (OMEGA-3 FISH OIL PO) Take by mouth.    . pantoprazole (PROTONIX) 40 MG tablet Take 40 mg by mouth daily.     No current facility-administered medications for this visit.       [name]  PHYSICAL EXAMINATION: ECOG PERFORMANCE STATUS: 0 - Asymptomatic Vitals:   09/17/17 1441  BP: 131/83  Pulse: 80  Temp: (!) 97.3 F (36.3 C)   Filed Weights   09/17/17 1441  Weight: 222 lb 6 oz (100.9 kg)    GENERAL:Alert, no distress and comfortable.  EYES: no pallor or icterus OROPHARYNX: no thrush or ulceration; good dentition  NECK: supple, no masses felt LYMPH:  no palpable lymphadenopathy in the cervical, axillary or inguinal regions LUNGS: clear to auscultation and  No wheeze or crackles HEART/CVS: regular rate & rhythm and no murmurs; No lower extremity edema ABDOMEN: abdomen soft, non-tender and normal bowel sounds Musculoskeletal:no cyanosis of digits and no clubbing  PSYCH: alert & oriented x 3  NEURO: no focal motor/sensory deficits SKIN:  no rashes or significant lesions. Plethora.   LABORATORY DATA:  I have reviewed the data as listed Lab Results  Component Value Date   WBC 10.9 (H) 09/17/2017   HGB 18.4 (H) 09/17/2017   HCT 52.7 (H) 09/17/2017   MCV 93.1 09/17/2017   PLT 226 09/17/2017    Recent Labs  09/17/17 1525  NA 140  K 4.3  CL 106  CO2 26  GLUCOSE 104*  BUN 14  CREATININE 0.91  CALCIUM 9.2  GFRNONAA >60  GFRAA >60  PROT 7.2  ALBUMIN 4.2  AST 21  ALT 27  ALKPHOS 64  BILITOT 0.4   Surgical Pathology 07/02/2017 DIAGNOSIS:  A. ULCERS, ILEUM; COLD BIOPSY:  - SMALL BOWEL MUCOSA WITH  FEATURES OF A HEALING MUCOSAL INJURY.  - NEGATIVE FOR DYSPLASIA AND MALIGNANCY.  B. TERMINAL ILEUM; COLD BIOPSY:  - NO PATHOLOGIC CHANGE.  C. ILEOCECAL OPENING; COLD BIOPSY:  - MILD CHRONIC ACTIVE ENTERITIS/COLITIS.  - NEGATIVE FOR DYSPLASIA AND MALIGNANCY.  D. COLON POLYP, CECUM; COLD BIOPSY:  - TUBULAR ADENOMA.  - NEGATIVE FOR HIGH-GRADE DYSPLASIA AND MALIGNANCY.  E. COLON, CECUM; COLD BIOPSY:  -NO PATHOLOGIC CHANGE.  F. ASCENDING COLON; COLD BIOPSY:  - NO PATHOLOGIC CHANGE.  G. TRANSVERSE COLON; COLD BIOPSY:  - NO PATHOLOGIC CHANGE.  H. DESCENDING COLON; COLD BIOPSY:  - NO PATHOLOGIC CHANGE.  I. SIGMOID COLON; COLD BIOPSY:  - SMALL LYMPHOID AGGREGATE AND HEMORRHAGE.  - NEGATIVE FOR COLITIS, DYSPLASIA AND MALIGNANCY.  J. RECTUM; COLD BIOPSY:  - NO PATHOLOGIC CHANGE.    RADIOGRAPHIC STUDIES: I have personally reviewed the radiological images as listed and agreed with the findings in the report. No recent images.  ASSESSMENT & PLAN:  1. Basophilia   2. Leukocytosis, unspecified type   3. Erythrocytosis   4. Neutrophilia   5. Current chronic use of systemic steroids    Lab results were discussed with patient concerning for myeloproliferative disease.  Given his history of crohn disease on chronic steroids, leukocytosis secondary to steroid is possible. However, I am more concerned with myeloproliferative disease given the combined leukocytopsis and erythrocytosis.  Will check cbc, cmp, Bcr-Abl, Jak2 exon 14 with reflex to CARL/MPL/Exon 12. Management of his polycythemia pending on work up.   All questions were answered. The patient knows to call the clinic with any problems questions or concerns. Thank you for this kind referral and the opportunity to participate in the care of this patient. A copy of today's note is routed to referring provider    Earlie Server, MD, PhD Hematology Oncology [facility] at [facility] Pager- [phone] 09/17/2017

## 2017-09-17 NOTE — Progress Notes (Signed)
Patient here today as a new patient for leukocytosis

## 2017-09-18 LAB — ERYTHROPOIETIN: Erythropoietin: 8.4 m[IU]/mL (ref 2.6–18.5)

## 2017-09-19 LAB — COMP PANEL: LEUKEMIA/LYMPHOMA

## 2017-09-21 LAB — BCR-ABL1 FISH
Cells Analyzed: 200
Cells Counted: 200
PDF LCA: 0

## 2017-09-25 ENCOUNTER — Inpatient Hospital Stay: Payer: 59 | Admitting: Oncology

## 2017-09-25 NOTE — Progress Notes (Deleted)
Hematology/Oncology Follow Up Note [facility] [phone] Fax:([phone]   Patient Care Team: [name], MD as PCP - General (Internal Medicine)  REFERRING PROVIDER: Dorian Furnace, Gastroenterology.  CHIEF COMPLAINTS/PURPOSE OF CONSULTATION:  Evaluation of leukocytosis and elevated hemoglobin.   HISTORY OF PRESENTING ILLNESS:  Todd Shepherd 46 y.o.  male with PMH listed as below who was referred by gastroenterology to me for evaluation of persistent leukocytosis and elevated hemoglobin.  Extensive medical records from both [place] were reviewed and HemOnc related problems and patient's complaints are listed as following  1 Crohn's disease: he has been on Humira 27m [place] weekly and Budesonide 646mPO daily. His Surveillance colonoscopy was  done 07/02/17 and demonstrated an adenoma. Per GI, the TI contained some small ulcers without signs of bleeding and mucosa in between ulcers normal. The IC valve was open and not narrowed. It was noted that the exam was otherwise normal. Sveral biopsies were taken: the ileal ulcers were noted to have hte characteristics of healing mucosal injury. There was mild active chronic colitis at the ileocecal opening. The TI, cecum, ascending, tranverse, descending, sigmoid colon, and rectum did not have any colitis/pathologic change. There was a small lymphoid aggregate in the sigmoid colon. There was no dysplasia/malignancy.  2 Persistent leukocytosis: wbc  was 10.8 on 07/12/2017, 12.8 on 07/23/2017 and 13.9 on 08/28/2017, ANC 8.98. Basophil 0.13,   3 Persistent elevated hemoglobin: Hb was 19.4, Hct 55.5 on 07/12/2017, Hb was 12.8 Hct  52.6 on 07/23/2017, Hb was 18.6 and Hct was 52.8 on 08/28/2017  4 Patient reports that he was seen by physician 10-15 years ago due to elevated blood counts and lab work up was done. He can not remember details of his diagnosis. He did not get any phlebotomy or  medication for that condition. He has itchiness, which is chronic, intermittent, usually triggered after hot shower.  Denies any history of blood clot.   ROS:  Review of Systems  Constitutional: Negative.   HENT:  Negative.   Eyes: Negative.   Respiratory: Negative.   Cardiovascular: Negative.   Gastrointestinal: Negative.   Endocrine: Negative.   Genitourinary: Negative.    Musculoskeletal: Negative.   Skin: Positive for itching.  Neurological: Negative.   Hematological: Negative.   Psychiatric/Behavioral: Negative.     MEDICAL HISTORY:  Past Medical History:  Diagnosis Date  . Benign hypertension   . Crohn's disease ([place]  . GERD (gastroesophageal reflux disease)   . Hyperlipidemia     SURGICAL HISTORY: Past Surgical History:  Procedure Laterality Date  . COLONOSCOPY WITH PROPOFOL N/A 07/02/2017   Procedure: COLONOSCOPY WITH PROPOFOL;  Surgeon: SkLollie SailsMD;  [facility];  Service: Endoscopy;  Laterality: N/A;  . colonoscopys     x6  . ESOPHAGOGASTRODUODENOSCOPY    . TONSILLECTOMY      SOCIAL HISTORY: Social History   Social History  . Marital status: Married    Spouse name: N/A  . Number of children: N/A  . Years of education: N/A   Occupational History  . Not on file.   Social History Main Topics  . Smoking status: Former SmResearch scientist (life sciences). Smokeless tobacco: Never Used  . Alcohol use No  . Drug use: No  . Sexual activity: Not on file   Other Topics Concern  . Not on file   Social History Narrative  . No narrative on file    FAMILY HISTORY: Family History  Problem Relation Age of Onset  . Diabetes Mother   . Hypertension Father   . Hypertension Maternal Grandmother   . Diabetes Paternal Grandmother   . Hypertension Paternal Grandmother   . Anemia Paternal Grandfather     ALLERGIES:  is allergic to amoxicillin; levaquin [levofloxacin]; and mercaptopurine.  MEDICATIONS:  Current Outpatient Prescriptions  Medication Sig  Dispense Refill  . ACIDOPHILUS LACTOBACILLUS PO Take by mouth.    . Adalimumab (HUMIRA PEN) 40 MG/0.8ML PNKT Inject into the skin.    . budesonide (ENTOCORT EC) 3 MG 24 hr capsule Take by mouth daily.    . Multiple Vitamin (MULTIVITAMIN) tablet Take 1 tablet by mouth daily.    . nebivolol (BYSTOLIC) 5 MG tablet Take 5 mg by mouth daily.    . Omega-3 Fatty Acids (OMEGA-3 FISH OIL PO) Take by mouth.    . pantoprazole (PROTONIX) 40 MG tablet Take 40 mg by mouth daily.     No current facility-administered medications for this visit.       [name]  PHYSICAL EXAMINATION: ECOG PERFORMANCE STATUS: 0 - Asymptomatic There were no vitals filed for this visit. There were no vitals filed for this visit.  GENERAL:Alert, no distress and comfortable.  EYES: no pallor or icterus OROPHARYNX: no thrush or ulceration; good dentition  NECK: supple, no masses felt LYMPH:  no palpable lymphadenopathy in the cervical, axillary or inguinal regions LUNGS: clear to auscultation and  No wheeze or crackles HEART/CVS: regular rate & rhythm and no murmurs; No lower extremity edema ABDOMEN: abdomen soft, non-tender and normal bowel sounds Musculoskeletal:no cyanosis of digits and no clubbing  PSYCH: alert & oriented x 3  NEURO: no focal motor/sensory deficits SKIN:  no rashes or significant lesions. Plethora.   LABORATORY DATA:  I have reviewed the data as listed Lab Results  Component Value Date   WBC 10.9 (H) 09/17/2017   HGB 18.4 (H) 09/17/2017   HCT 52.7 (H) 09/17/2017   MCV 93.1 09/17/2017   PLT 226 09/17/2017    Recent Labs  09/17/17 1525  NA 140  K 4.3  CL 106  CO2 26  GLUCOSE 104*  BUN 14  CREATININE 0.91  CALCIUM 9.2  GFRNONAA >60  GFRAA >60  PROT 7.2  ALBUMIN 4.2  AST 21  ALT 27  ALKPHOS 64  BILITOT 0.4   Surgical Pathology 07/02/2017 DIAGNOSIS:  A. ULCERS, ILEUM; COLD BIOPSY:  - SMALL BOWEL MUCOSA WITH FEATURES OF A HEALING MUCOSAL INJURY.  - NEGATIVE FOR DYSPLASIA AND  MALIGNANCY.  B. TERMINAL ILEUM; COLD BIOPSY:  - NO PATHOLOGIC CHANGE.  C. ILEOCECAL OPENING; COLD BIOPSY:  - MILD CHRONIC ACTIVE ENTERITIS/COLITIS.  - NEGATIVE FOR DYSPLASIA AND MALIGNANCY.  D. COLON POLYP, CECUM; COLD BIOPSY:  - TUBULAR ADENOMA.  - NEGATIVE FOR HIGH-GRADE DYSPLASIA AND MALIGNANCY.  E. COLON, CECUM; COLD BIOPSY:  -NO PATHOLOGIC CHANGE.  F. ASCENDING COLON; COLD BIOPSY:  - NO PATHOLOGIC CHANGE.  G. TRANSVERSE COLON; COLD BIOPSY:  - NO PATHOLOGIC CHANGE.  H. DESCENDING COLON; COLD BIOPSY:  - NO PATHOLOGIC CHANGE.  I. SIGMOID COLON; COLD BIOPSY:  - SMALL LYMPHOID AGGREGATE AND HEMORRHAGE.  - NEGATIVE FOR COLITIS, DYSPLASIA AND MALIGNANCY.  J. RECTUM; COLD BIOPSY:  - NO PATHOLOGIC CHANGE.    RADIOGRAPHIC STUDIES: I have personally reviewed the radiological images as listed and agreed with the findings in the report. No recent images.   ASSESSMENT & PLAN:  No diagnosis found. Lab results were discussed with patient concerning for myeloproliferative disease.  Given his history of crohn disease on chronic steroids, leukocytosis secondary to steroid is possible. However, I am more concerned with myeloproliferative disease given the combined leukocytopsis and erythrocytosis.  Will check cbc, cmp, Bcr-Abl, Jak2 exon 14 with reflex to CARL/MPL/Exon 12. Management of his polycythemia pending on work up.   All questions were answered. The patient knows to call the clinic with any problems questions or concerns. Thank you for this kind referral and the opportunity to participate in the care of this patient. A copy of today's note is routed to referring provider    Earlie Server, MD, PhD Hematology Oncology [facility] at [facility] Pager- [phone] 09/25/2017

## 2017-09-27 NOTE — Progress Notes (Deleted)
Hematology/Oncology Consult note [facility] Telephone:([phone] Fax:(336) [phone]  CONSULT NOTE Patient Care Team: [name], MD as PCP - General (Internal Medicine)  REFERRING PROVIDER: Dorian Furnace, Gastroenterology.  CHIEF COMPLAINTS/PURPOSE OF CONSULTATION:  Evaluation of leukocytosis and elevated hemoglobin.   HISTORY OF PRESENTING ILLNESS:  Todd Shepherd 46 y.o.  male with PMH listed as below who was referred by gastroenterology to me for evaluation of persistent leukocytosis and elevated hemoglobin.  Extensive medical records from both [place] were reviewed and HemOnc related problems and patient's complaints are listed as following  1 Crohn's disease: he has been on Humira 33m [place] weekly and Budesonide 684mPO daily. His Surveillance colonoscopy was  done 07/02/17 and demonstrated an adenoma. Per GI, the TI contained some small ulcers without signs of bleeding and mucosa in between ulcers normal. The IC valve was open and not narrowed. It was noted that the exam was otherwise normal. Sveral biopsies were taken: the ileal ulcers were noted to have hte characteristics of healing mucosal injury. There was mild active chronic colitis at the ileocecal opening. The TI, cecum, ascending, tranverse, descending, sigmoid colon, and rectum did not have any colitis/pathologic change. There was a small lymphoid aggregate in the sigmoid colon. There was no dysplasia/malignancy.  2 Persistent leukocytosis: wbc  was 10.8 on 07/12/2017, 12.8 on 07/23/2017 and 13.9 on 08/28/2017, ANC 8.98. Basophil 0.13,   3 Persistent elevated hemoglobin: Hb was 19.4, Hct 55.5 on 07/12/2017, Hb was 12.8 Hct  52.6 on 07/23/2017, Hb was 18.6 and Hct was 52.8 on 08/28/2017  4 Patient reports that he was seen by physician 10-15 years ago due to elevated blood counts and lab work up was done. He can not remember details of his diagnosis. He did not get any phlebotomy  or medication for that condition. He has itchiness, which is chronic, intermittent, usually triggered after hot shower.  Denies any history of blood clot.   ROS:  Review of Systems  Constitutional: Negative.   HENT:  Negative.   Eyes: Negative.   Respiratory: Negative.   Cardiovascular: Negative.   Gastrointestinal: Negative.   Endocrine: Negative.   Genitourinary: Negative.    Musculoskeletal: Negative.   Skin: Positive for itching.  Neurological: Negative.   Hematological: Negative.   Psychiatric/Behavioral: Negative.     MEDICAL HISTORY:  Past Medical History:  Diagnosis Date  . Benign hypertension   . Cro[place]'s disease ([place]  . GERD (gastroesophageal reflux disease)   . Hyperlipidemia     SURGICAL HISTORY: Past Surgical History:  Procedure Laterality Date  . COLONOSCOPY WITH PROPOFOL N/A 07/02/2017   Procedure: COLONOSCOPY WITH PROPOFOL;  Surgeon: SkLollie SailsMD;  [facility];  Service: Endoscopy;  Laterality: N/A;  . colonoscopys     x6  . ESOPHAGOGASTRODUODENOSCOPY    . TONSILLECTOMY      SOCIAL HISTORY: Social History   Social History  . Marital status: Married    Spouse name: N/A  . Number of children: N/A  . Years of education: N/A   Occupational History  . Not on file.   Social History Main Topics  . Smoking status: Former SmResearch scientist (life sciences). Smokeless tobacco: Never Used  . Alcohol use No  . Drug use: No  . Sexual activity: Not on file   Other Topics Concern  . Not on file   Social History Narrative  . No narrative on file    FAMILY HISTORY: Family History  Problem Relation Age of Onset  . Diabetes Mother   . Hypertension Father   . Hypertension Maternal Grandmother   . Diabetes Paternal Grandmother   . Hypertension Paternal Grandmother   . Anemia Paternal Grandfather     ALLERGIES:  is allergic to amoxicillin; levaquin [levofloxacin]; and mercaptopurine.  MEDICATIONS:  Current Outpatient Prescriptions  Medication  Sig Dispense Refill  . ACIDOPHILUS LACTOBACILLUS PO Take by mouth.    . Adalimumab (HUMIRA PEN) 40 MG/0.8ML PNKT Inject into the skin.    . budesonide (ENTOCORT EC) 3 MG 24 hr capsule Take by mouth daily.    . Multiple Vitamin (MULTIVITAMIN) tablet Take 1 tablet by mouth daily.    . nebivolol (BYSTOLIC) 5 MG tablet Take 5 mg by mouth daily.    . Omega-3 Fatty Acids (OMEGA-3 FISH OIL PO) Take by mouth.    . pantoprazole (PROTONIX) 40 MG tablet Take 40 mg by mouth daily.     No current facility-administered medications for this visit.       [name]  PHYSICAL EXAMINATION: ECOG PERFORMANCE STATUS: 0 - Asymptomatic There were no vitals filed for this visit. There were no vitals filed for this visit.  GENERAL:Alert, no distress and comfortable.  EYES: no pallor or icterus OROPHARYNX: no thrush or ulceration; good dentition  NECK: supple, no masses felt LYMPH:  no palpable lymphadenopathy in the cervical, axillary or inguinal regions LUNGS: clear to auscultation and  No wheeze or crackles HEART/CVS: regular rate & rhythm and no murmurs; No lower extremity edema ABDOMEN: abdomen soft, non-tender and normal bowel sounds Musculoskeletal:no cyanosis of digits and no clubbing  PSYCH: alert & oriented x 3  NEURO: no focal motor/sensory deficits SKIN:  no rashes or significant lesions. Plethora.   LABORATORY DATA:  I have reviewed the data as listed Lab Results  Component Value Date   WBC 10.9 (H) 09/17/2017   HGB 18.4 (H) 09/17/2017   HCT 52.7 (H) 09/17/2017   MCV 93.1 09/17/2017   PLT 226 09/17/2017    Recent Labs  09/17/17 1525  NA 140  K 4.3  CL 106  CO2 26  GLUCOSE 104*  BUN 14  CREATININE 0.91  CALCIUM 9.2  GFRNONAA >60  GFRAA >60  PROT 7.2  ALBUMIN 4.2  AST 21  ALT 27  ALKPHOS 64  BILITOT 0.4   Surgical Pathology 07/02/2017 DIAGNOSIS:  A. ULCERS, ILEUM; COLD BIOPSY:  - SMALL BOWEL MUCOSA WITH FEATURES OF A HEALING MUCOSAL INJURY.  - NEGATIVE FOR DYSPLASIA AND  MALIGNANCY.  B. TERMINAL ILEUM; COLD BIOPSY:  - NO PATHOLOGIC CHANGE.  C. ILEOCECAL OPENING; COLD BIOPSY:  - MILD CHRONIC ACTIVE ENTERITIS/COLITIS.  - NEGATIVE FOR DYSPLASIA AND MALIGNANCY.  D. COLON POLYP, CECUM; COLD BIOPSY:  - TUBULAR ADENOMA.  - NEGATIVE FOR HIGH-GRADE DYSPLASIA AND MALIGNANCY.  E. COLON, CECUM; COLD BIOPSY:  -NO PATHOLOGIC CHANGE.  F. ASCENDING COLON; COLD BIOPSY:  - NO PATHOLOGIC CHANGE.  G. TRANSVERSE COLON; COLD BIOPSY:  - NO PATHOLOGIC CHANGE.  H. DESCENDING COLON; COLD BIOPSY:  - NO PATHOLOGIC CHANGE.  I. SIGMOID COLON; COLD BIOPSY:  - SMALL LYMPHOID AGGREGATE AND HEMORRHAGE.  - NEGATIVE FOR COLITIS, DYSPLASIA AND MALIGNANCY.  J. RECTUM; COLD BIOPSY:  - NO PATHOLOGIC CHANGE.    RADIOGRAPHIC STUDIES: I have personally reviewed the radiological images as listed and agreed with the findings in the report. No recent images.   ASSESSMENT & PLAN:  No diagnosis found. Lab results were discussed with patient concerning for myeloproliferative disease.  Given his history of crohn disease on chronic steroids, leukocytosis secondary to steroid is possible. However, I am more concerned with myeloproliferative disease given the combined leukocytopsis and erythrocytosis.  Will check cbc, cmp, Bcr-Abl, Jak2 exon 14 with reflex to CARL/MPL/Exon 12. Management of his polycythemia pending on work up.   All questions were answered. The patient knows to call the clinic with any problems questions or concerns. Thank you for this kind referral and the opportunity to participate in the care of this patient. A copy of today's note is routed to referring provider    Earlie Server, MD, PhD Hematology Oncology [facility] at [facility] Pager- [phone] 09/27/2017

## 2017-09-28 ENCOUNTER — Inpatient Hospital Stay: Payer: 59 | Admitting: Oncology

## 2017-09-28 LAB — CALR + JAK2 E12-15 + MPL (REFLEXED)

## 2017-09-28 LAB — JAK2 V617F, W REFLEX TO CALR/E12/MPL

## 2017-10-03 ENCOUNTER — Inpatient Hospital Stay: Payer: 59

## 2017-10-03 ENCOUNTER — Inpatient Hospital Stay (HOSPITAL_BASED_OUTPATIENT_CLINIC_OR_DEPARTMENT_OTHER): Payer: 59 | Admitting: Oncology

## 2017-10-03 VITALS — BP 127/74 | HR 74 | Temp 97.7°F | Wt 220.6 lb

## 2017-10-03 DIAGNOSIS — K219 Gastro-esophageal reflux disease without esophagitis: Secondary | ICD-10-CM

## 2017-10-03 DIAGNOSIS — D72829 Elevated white blood cell count, unspecified: Secondary | ICD-10-CM | POA: Diagnosis not present

## 2017-10-03 DIAGNOSIS — Z87891 Personal history of nicotine dependence: Secondary | ICD-10-CM

## 2017-10-03 DIAGNOSIS — D751 Secondary polycythemia: Secondary | ICD-10-CM

## 2017-10-03 DIAGNOSIS — Z7952 Long term (current) use of systemic steroids: Secondary | ICD-10-CM

## 2017-10-03 DIAGNOSIS — D729 Disorder of white blood cells, unspecified: Secondary | ICD-10-CM

## 2017-10-03 DIAGNOSIS — Z79899 Other long term (current) drug therapy: Secondary | ICD-10-CM

## 2017-10-03 DIAGNOSIS — Z8711 Personal history of peptic ulcer disease: Secondary | ICD-10-CM

## 2017-10-03 DIAGNOSIS — D582 Other hemoglobinopathies: Secondary | ICD-10-CM

## 2017-10-03 DIAGNOSIS — K5090 Crohn's disease, unspecified, without complications: Secondary | ICD-10-CM | POA: Diagnosis not present

## 2017-10-03 DIAGNOSIS — I10 Essential (primary) hypertension: Secondary | ICD-10-CM

## 2017-10-03 DIAGNOSIS — E785 Hyperlipidemia, unspecified: Secondary | ICD-10-CM

## 2017-10-03 DIAGNOSIS — R7989 Other specified abnormal findings of blood chemistry: Secondary | ICD-10-CM | POA: Diagnosis not present

## 2017-10-03 LAB — URINALYSIS, COMPLETE (UACMP) WITH MICROSCOPIC
BACTERIA UA: NONE SEEN
Bilirubin Urine: NEGATIVE
Glucose, UA: NEGATIVE mg/dL
HGB URINE DIPSTICK: NEGATIVE
Ketones, ur: NEGATIVE mg/dL
Leukocytes, UA: NEGATIVE
NITRITE: NEGATIVE
PROTEIN: NEGATIVE mg/dL
SPECIFIC GRAVITY, URINE: 1.013 (ref 1.005–1.030)
SQUAMOUS EPITHELIAL / LPF: NONE SEEN
pH: 7 (ref 5.0–8.0)

## 2017-10-03 LAB — CBC
HEMATOCRIT: 53 % — AB (ref 40.0–52.0)
HEMOGLOBIN: 18.2 g/dL — AB (ref 13.0–18.0)
MCH: 32.3 pg (ref 26.0–34.0)
MCHC: 34.3 g/dL (ref 32.0–36.0)
MCV: 94.1 fL (ref 80.0–100.0)
Platelets: 240 10*3/uL (ref 150–440)
RBC: 5.63 MIL/uL (ref 4.40–5.90)
RDW: 13.2 % (ref 11.5–14.5)
WBC: 14.4 10*3/uL — ABNORMAL HIGH (ref 3.8–10.6)

## 2017-10-03 NOTE — Progress Notes (Signed)
Hematology/Oncology Consult note [facility] Telephone:([phone] [phone]  CONSULT NOTE Patient Care Team: [name], MD as PCP - General (Internal Medicine)  REFERRING PROVIDER: Dorian Furnace, Gastroenterology.  CHIEF COMPLAINTS/PURPOSE OF CONSULTATION:  Evaluation of leukocytosis and elevated hemoglobin.   HISTORY OF PRESENTING ILLNESS:  Todd Shepherd 46 y.o.  male with PMH listed as below who was referred by gastroenterology to me for evaluation of persistent leukocytosis and elevated hemoglobin.  Extensive medical records from both [place] were reviewed and HemOnc related problems and patient's complaints are listed as following  1 Crohn's disease: he has been on Humira 20m [place] weekly and Budesonide 64mPO daily. His Surveillance colonoscopy was  done 07/02/17 and demonstrated an adenoma. Per GI, the TI contained some small ulcers without signs of bleeding and mucosa in between ulcers normal. The IC valve was open and not narrowed. It was noted that the exam was otherwise normal. Sveral biopsies were taken: the ileal ulcers were noted to have hte characteristics of healing mucosal injury. There was mild active chronic colitis at the ileocecal opening. The TI, cecum, ascending, tranverse, descending, sigmoid colon, and rectum did not have any colitis/pathologic change. There was a small lymphoid aggregate in the sigmoid colon. There was no dysplasia/malignancy.  2 Persistent leukocytosis: wbc  was 10.8 on 07/12/2017, 12.8 on 07/23/2017 and 13.9 on 08/28/2017, ANC 8.98. Basophil 0.13,   3 Persistent elevated hemoglobin: Hb was 19.4, Hct 55.5 on 07/12/2017, Hb was 12.8 Hct  52.6 on 07/23/2017, Hb was 18.6 and Hct was 52.8 on 08/28/2017  4 Patient reports that he was seen by physician 10-15 years ago due to elevated blood counts and lab work up was done. He can not remember details of his diagnosis. He did not get any phlebotomy  or medication for that condition. He has itchiness, which is chronic, intermittent, usually triggered after hot shower.  Denies any history of blood clot.   ROS:  Review of Systems  Constitutional: Negative.   HENT:  Negative.   Eyes: Negative.   Respiratory: Negative.   Cardiovascular: Negative.   Gastrointestinal: Negative.   Endocrine: Negative.   Genitourinary: Negative.    Musculoskeletal: Negative.   Skin: Positive for itching.  Neurological: Negative.   Hematological: Negative.   Psychiatric/Behavioral: Negative.     MEDICAL HISTORY:  Past Medical History:  Diagnosis Date  . Benign hypertension   . Crohn's disease ([place]  . GERD (gastroesophageal reflux disease)   . Hyperlipidemia     SURGICAL HISTORY: Past Surgical History:  Procedure Laterality Date  . COLONOSCOPY WITH PROPOFOL N/A 07/02/2017   Procedure: COLONOSCOPY WITH PROPOFOL;  Surgeon: SkLollie SailsMD;  [facility];  Service: Endoscopy;  Laterality: N/A;  . colonoscopys     x6  . ESOPHAGOGASTRODUODENOSCOPY    . TONSILLECTOMY      SOCIAL HISTORY: Social History   Social History  . Marital status: Married    Spouse name: N/A  . Number of children: N/A  . Years of education: N/A   Occupational History  . Not on file.   Social History Main Topics  . Smoking status: Former SmResearch scientist (life sciences). Smokeless tobacco: Never Used  . Alcohol use No  . Drug use: No  . Sexual activity: Not on file   Other Topics Concern  . Not on file   Social History Narrative  . No narrative on file    FAMILY HISTORY: Family History  Problem Relation Age of Onset  . Diabetes Mother   . Hypertension Father   . Hypertension Maternal Grandmother   . Diabetes Paternal Grandmother   . Hypertension Paternal Grandmother   . Anemia Paternal Grandfather     ALLERGIES:  is allergic to amoxicillin; levaquin [levofloxacin]; and mercaptopurine.  MEDICATIONS:  Current Outpatient Prescriptions  Medication  Sig Dispense Refill  . ACIDOPHILUS LACTOBACILLUS PO Take by mouth.    . Adalimumab (HUMIRA PEN) 40 MG/0.8ML PNKT Inject into the skin.    . budesonide (ENTOCORT EC) 3 MG 24 hr capsule Take 2 mg by mouth daily.     . Multiple Vitamin (MULTIVITAMIN) tablet Take 1 tablet by mouth daily.    . nebivolol (BYSTOLIC) 5 MG tablet Take 5 mg by mouth daily.    . Omega-3 Fatty Acids (OMEGA-3 FISH OIL PO) Take by mouth.    . pantoprazole (PROTONIX) 40 MG tablet Take 40 mg by mouth daily.     No current facility-administered medications for this visit.       [name]  PHYSICAL EXAMINATION: ECOG PERFORMANCE STATUS: 0 - Asymptomatic Vitals:   10/03/17 1448  BP: 127/74  Pulse: 74  Temp: 97.7 F (36.5 C)   Filed Weights   10/03/17 1448  Weight: 220 lb 9 oz (100 kg)    GENERAL:Alert, no distress and comfortable.  EYES: no pallor or icterus OROPHARYNX: no thrush or ulceration; good dentition  NECK: supple, no masses felt LYMPH:  no palpable lymphadenopathy in the cervical, axillary or inguinal regions LUNGS: clear to auscultation and  No wheeze or crackles HEART/CVS: regular rate & rhythm and no murmurs; No lower extremity edema ABDOMEN: abdomen soft, non-tender and normal bowel sounds Musculoskeletal:no cyanosis of digits and no clubbing  PSYCH: alert & oriented x 3  NEURO: no focal motor/sensory deficits SKIN:  no rashes or significant lesions. [place].   LABORATORY DATA:  I have reviewed the data as listed Lab Results  Component Value Date   WBC 10.9 (H) 09/17/2017   HGB 18.4 (H) 09/17/2017   HCT 52.7 (H) 09/17/2017   MCV 93.1 09/17/2017   PLT 226 09/17/2017    Recent Labs  09/17/17 1525  NA 140  K 4.3  CL 106  CO2 26  GLUCOSE 104*  BUN 14  CREATININE 0.91  CALCIUM 9.2  GFRNONAA >60  GFRAA >60  PROT 7.2  ALBUMIN 4.2  AST 21  ALT 27  ALKPHOS 64  BILITOT 0.4   Surgical Pathology 07/02/2017 DIAGNOSIS:  A. ULCERS, ILEUM; COLD BIOPSY:  - SMALL BOWEL MUCOSA WITH  FEATURES OF A HEALING MUCOSAL INJURY.  - NEGATIVE FOR DYSPLASIA AND MALIGNANCY.  B. TERMINAL ILEUM; COLD BIOPSY:  - NO PATHOLOGIC CHANGE.  C. ILEOCECAL OPENING; COLD BIOPSY:  - MILD CHRONIC ACTIVE ENTERITIS/COLITIS.  - NEGATIVE FOR DYSPLASIA AND MALIGNANCY.  D. COLON POLYP, CECUM; COLD BIOPSY:  - TUBULAR ADENOMA.  - NEGATIVE FOR HIGH-GRADE DYSPLASIA AND MALIGNANCY.  E. COLON, CECUM; COLD BIOPSY:  -NO PATHOLOGIC CHANGE.  F. ASCENDING COLON; COLD BIOPSY:  - NO PATHOLOGIC CHANGE.  G. TRANSVERSE COLON; COLD BIOPSY:  - NO PATHOLOGIC CHANGE.  H. DESCENDING COLON; COLD BIOPSY:  - NO PATHOLOGIC CHANGE.  I. SIGMOID COLON; COLD BIOPSY:  - SMALL LYMPHOID AGGREGATE AND HEMORRHAGE.  - NEGATIVE FOR COLITIS, DYSPLASIA AND MALIGNANCY.  J. RECTUM; COLD BIOPSY:  - NO PATHOLOGIC CHANGE.    RADIOGRAPHIC STUDIES: I have personally reviewed the radiological images as listed and agreed with the findings in the report. No  recent images.   ASSESSMENT & PLAN:  1. High blood hemoglobin F (HCC)   2. Leukocytosis, unspecified type   3. Erythrocytosis   4. Neutrophilia   5. Current chronic use of systemic steroids    Lab results were discussed with patient.   Given his history of crohn disease on chronic steroids, leukocytosis secondary to steroid is possible. Negative for, Bcr-Abl, Jak2 exon 14 with reflex to CARL/MPL/Exon 12.  I refer patient to pulmonary for sleep apnea workup. Will also schedule phlebotomy x 2 units next week.  All questions were answered. The patient knows to call the clinic with any problems questions or concerns. Thank you for this kind referral and the opportunity to participate in the care of this patient. A copy of today's note is routed to referring provider    Earlie Server, MD, PhD Hematology Oncology [facility] Pager- [phone] 10/03/2017  Addendum:  Called patient as his Norma Fredrickson monoxide level is elevated. After further  questioning, patient admit that he smokes cigar, usually one cigar every other day. Discussed about the possibility of second polycythemia. Advice to quit smoking cigar. He voices understanding. His home furnace was recently checked and there was no gas leak.   Earlie Server

## 2017-10-03 NOTE — Progress Notes (Signed)
Patient here today as a follow up.  Patient states no new concerns today

## 2017-10-04 ENCOUNTER — Encounter: Payer: Self-pay | Admitting: Oncology

## 2017-10-04 DIAGNOSIS — D751 Secondary polycythemia: Secondary | ICD-10-CM | POA: Insufficient documentation

## 2017-10-04 LAB — CARBON MONOXIDE, BLOOD (PERFORMED AT REF LAB): CARBON MONOXIDE, BLOOD: 9.8 % — AB (ref 0.0–3.6)

## 2017-10-08 DIAGNOSIS — D582 Other hemoglobinopathies: Secondary | ICD-10-CM | POA: Insufficient documentation

## 2017-10-09 ENCOUNTER — Institutional Professional Consult (permissible substitution): Payer: 59 | Admitting: Internal Medicine

## 2017-10-10 ENCOUNTER — Inpatient Hospital Stay: Payer: 59

## 2017-10-10 VITALS — BP 124/83 | HR 65

## 2017-10-10 DIAGNOSIS — D751 Secondary polycythemia: Secondary | ICD-10-CM

## 2017-10-10 DIAGNOSIS — D72829 Elevated white blood cell count, unspecified: Secondary | ICD-10-CM | POA: Diagnosis not present

## 2017-10-12 ENCOUNTER — Telehealth: Payer: Self-pay | Admitting: *Deleted

## 2017-10-12 ENCOUNTER — Other Ambulatory Visit: Payer: Self-pay | Admitting: Internal Medicine

## 2017-10-12 DIAGNOSIS — R7981 Abnormal blood-gas level: Secondary | ICD-10-CM

## 2017-10-12 NOTE — Telephone Encounter (Signed)
LMTCB

## 2017-10-12 NOTE — Telephone Encounter (Signed)
-----   Message from Laverle Hobby, MD sent at 10/12/2017  9:16 AM EDT ----- Regarding: CXR before appt Please see if this patient can do a CXR before his appt on Monday, thanks.

## 2017-10-12 NOTE — Progress Notes (Signed)
[facility] Consultation      Assessment and Plan:  46 year old male history of hypertension and polycythemia of uncertain etiology, now noted to have witnessed apneic episodes suspicious for sleep apnea.  Excessive daytime sleepiness. -Symptoms and signs of obstructive sleep apnea, with witnessed apneic episodes and loud snoring.  Symptoms are improved now that he has lost amount of weight. -We will send for home sleep test to rule out sleep apnea.  Polycythemia, essential hypertension. - Will send for sleep study as above, also rule out elevated carbon monoxide and methemoglobin levels.  Nicotine abuse. -Patient reports occasional use of tobacco, recently decreased.  We discussed the importance of complete cessation, greater than 3 minutes spent in discussion.  If sleep study positive will be started on CPAP and can follow-up in 3 months, otherwise if the test is negative patient can follow-up with referring provider.  Date: 10/15/2017  MRN# [id number] JERRET MCBANE 10/31/1971    Tressie Stalker is a 46 y.o. old male seen in consultation for chief complaint of:    Chief Complaint  Patient presents with  . Advice Only    Referred by Dr.Yu elevated carbon dioxide levels. Pt had cxr done today.    HPI:   Mr. skidmore (tin-jen) is a 46 year old male with a history of Crohn's disease, he is noted to have a history of leukocytosis, and elevated hemoglobin count. He used to have trouble sleeping, he was told that he snores and he stopped breathing. He has lost 60 pounds so these symptoms have improved.  Occasional cigar smoker, now stopped for past 3 weeks.   Review of most recent chemistry panel on 09/17/17, shows normal CO2 level and renal function.  CBC at that time showed a hemoglobin of 18.4, absolute eosinophil count of 300, otherwise normal differential. Epworth scale is 4 today.   PMHX:   Past Medical History:  Diagnosis Date  . Benign hypertension   .  Crohn's disease ([place])   . GERD (gastroesophageal reflux disease)   . Hyperlipidemia    Surgical Hx:  Past Surgical History:  Procedure Laterality Date  . COLONOSCOPY WITH PROPOFOL N/A 07/02/2017   Procedure: COLONOSCOPY WITH PROPOFOL;  Surgeon: Lollie Sails, MD;  [facility];  Service: Endoscopy;  Laterality: N/A;  . colonoscopys     x6  . ESOPHAGOGASTRODUODENOSCOPY    . TONSILLECTOMY     Family Hx:  Family History  Problem Relation Age of Onset  . Diabetes Mother   . Hypertension Father   . Hypertension Maternal Grandmother   . Diabetes Paternal Grandmother   . Hypertension Paternal Grandmother   . Anemia Paternal Grandfather    Social Hx:   Social History  Substance Use Topics  . Smoking status: Current Some Day Smoker    Types: Cigars  . Smokeless tobacco: Never Used  . Alcohol use No   Medication:    Current Outpatient Prescriptions:  .  ACIDOPHILUS LACTOBACILLUS PO, Take by mouth., Disp: , Rfl:  .  Adalimumab (HUMIRA PEN) 40 MG/0.8ML PNKT, Inject into the skin., Disp: , Rfl:  .  budesonide (ENTOCORT EC) 3 MG 24 hr capsule, Take 2 mg by mouth daily. , Disp: , Rfl:  .  Multiple Vitamin (MULTIVITAMIN) tablet, Take 1 tablet by mouth daily., Disp: , Rfl:  .  nebivolol (BYSTOLIC) 5 MG tablet, Take 5 mg by mouth daily., Disp: , Rfl:  .  Omega-3 Fatty Acids (OMEGA-3 FISH OIL PO), Take by mouth., Disp: , Rfl:  .  pantoprazole (PROTONIX) 40 MG tablet, Take 40 mg by mouth daily., Disp: , Rfl:    Allergies:  Amoxicillin; Levaquin [levofloxacin]; and Mercaptopurine  Review of Systems: Gen:  Denies  fever, sweats, chills HEENT: Denies blurred vision, double vision. bleeds, sore throat Cvc:  No dizziness, chest pain. Resp:   Denies cough or sputum production, shortness of breath Gi: Denies swallowing difficulty, stomach pain. Gu:  Denies bladder incontinence, burning urine Ext:   No Joint pain, stiffness. Skin: No skin rash,  hives  Endoc:  No polyuria,  polydipsia. Psych: No depression, insomnia. Other:  All other systems were reviewed with the patient and were negative other that what is mentioned in the HPI.   Physical Examination:   VS: BP (!) 144/86 (BP Location: Left Arm, Cuff Size: Normal)   Pulse 85   Resp 16   Ht 6' 1"  (1.854 m)   Wt 221 lb (100.2 kg)   SpO2 98%   BMI 29.16 kg/m   General Appearance: No distress  Neuro:without focal findings,  speech normal,  HEENT: PERRLA, EOM intact.   Mallampati 3 Pulmonary: normal breath sounds, No wheezing.  CardiovascularNormal S1,S2.  No m/r/g.   Abdomen: Benign, Soft, non-tender. Renal:  No costovertebral tenderness  GU:  No performed at this time. Endoc: No evident thyromegaly, no signs of acromegaly. Skin:   warm, no rashes, no ecchymosis  Extremities: normal, no cyanosis, clubbing.  Other findings:    LABORATORY PANEL:   CBC No results for input(s): WBC, HGB, HCT, PLT in the last 168 hours. ------------------------------------------------------------------------------------------------------------------  Chemistries  No results for input(s): NA, K, CL, CO2, GLUCOSE, BUN, CREATININE, CALCIUM, MG, AST, ALT, ALKPHOS, BILITOT in the last 168 hours.  Invalid input(s): GFRCGP ------------------------------------------------------------------------------------------------------------------  Cardiac Enzymes No results for input(s): TROPONINI in the last 168 hours. ------------------------------------------------------------  RADIOLOGY:  No results found.     Thank  you for the consultation and for allowing [facility] to assist in the care of your patient. Our recommendations are noted above.  Please contact us if we can be of further service.   Marda Stalker, MD.  Board Certified in Internal Medicine, Pulmonary Medicine, [place], and Sleep Medicine.   Pulmonary and Critical Care Office Number: [phone]  Patricia Pesa, M.D.  Merton Border, M.D  10/15/2017

## 2017-10-12 NOTE — Telephone Encounter (Signed)
Patient aware cxr needed before appt on 10/15/17.ss

## 2017-10-15 ENCOUNTER — Encounter: Payer: Self-pay | Admitting: Internal Medicine

## 2017-10-15 ENCOUNTER — Ambulatory Visit
Admission: RE | Admit: 2017-10-15 | Discharge: 2017-10-15 | Disposition: A | Discharge status: Home / Self Care | Payer: 59 | Source: Ambulatory Visit | Attending: Internal Medicine | Admitting: Internal Medicine

## 2017-10-15 ENCOUNTER — Ambulatory Visit (INDEPENDENT_AMBULATORY_CARE_PROVIDER_SITE_OTHER): Payer: 59 | Admitting: Internal Medicine

## 2017-10-15 ENCOUNTER — Other Ambulatory Visit
Admission: RE | Admit: 2017-10-15 | Discharge: 2017-10-15 | Disposition: A | Discharge status: Home / Self Care | Payer: 59 | Source: Ambulatory Visit | Attending: Internal Medicine | Admitting: Internal Medicine

## 2017-10-15 ENCOUNTER — Other Ambulatory Visit: Payer: Self-pay | Admitting: Internal Medicine

## 2017-10-15 VITALS — BP 144/86 | HR 85 | Resp 16 | Ht 73.0 in | Wt 221.0 lb

## 2017-10-15 DIAGNOSIS — D751 Secondary polycythemia: Secondary | ICD-10-CM | POA: Diagnosis not present

## 2017-10-15 DIAGNOSIS — R7981 Abnormal blood-gas level: Secondary | ICD-10-CM | POA: Diagnosis not present

## 2017-10-15 DIAGNOSIS — G4719 Other hypersomnia: Secondary | ICD-10-CM

## 2017-10-15 DIAGNOSIS — F17210 Nicotine dependence, cigarettes, uncomplicated: Secondary | ICD-10-CM | POA: Diagnosis not present

## 2017-10-15 DIAGNOSIS — I10 Essential (primary) hypertension: Secondary | ICD-10-CM | POA: Insufficient documentation

## 2017-10-15 DIAGNOSIS — E785 Hyperlipidemia, unspecified: Secondary | ICD-10-CM | POA: Insufficient documentation

## 2017-10-15 LAB — BLOOD GAS, ARTERIAL
ACID-BASE EXCESS: 0.9 mmol/L (ref 0.0–2.0)
ALLENS TEST (PASS/FAIL): POSITIVE — AB
BICARBONATE: 24.3 mmol/L (ref 20.0–28.0)
FIO2: 0.21
O2 Saturation: 97.6 %
PATIENT TEMPERATURE: 37
PO2 ART: 93 mmHg (ref 83.0–108.0)
pCO2 arterial: 35 mmHg (ref 32.0–48.0)
pH, Arterial: 7.45 (ref 7.350–7.450)

## 2017-10-15 NOTE — Patient Instructions (Addendum)
--  Will send for sleep study.  --Will check arterial blood gas.     Sleep Apnea Sleep apnea is disorder that affects a person's sleep. A person with sleep apnea has abnormal pauses in their breathing when they sleep. It is hard for them to get a good sleep. This makes a person tired during the day. It also can lead to other physical problems. There are three types of sleep apnea. One type is when breathing stops for a short time because your airway is blocked (obstructive sleep apnea). Another type is when the brain sometimes fails to give the normal signal to breathe to the muscles that control your breathing (central sleep apnea). The third type is a combination of the other two types. HOME CARE   Take all medicine as told by your doctor.  Avoid alcohol, calming medicines (sedatives), and depressant drugs.  Try to lose weight if you are overweight. Talk to your doctor about a healthy weight goal.  Your doctor may have you use a device that helps to open your airway. It can help you get the air that you need. It is called a positive airway pressure (PAP) device.   MAKE SURE YOU:   Understand these instructions.  Will watch your condition.  Will get help right away if you are not doing well or get worse.  It may take approximately 1 month for you to get used to wearing her CPAP every night.  Be sure to work with your machine to get used to it, be patient, it may take time!

## 2017-10-31 ENCOUNTER — Inpatient Hospital Stay: Payer: 59

## 2017-10-31 ENCOUNTER — Other Ambulatory Visit: Payer: Self-pay

## 2017-10-31 ENCOUNTER — Inpatient Hospital Stay: Payer: 59 | Attending: Oncology | Admitting: Oncology

## 2017-10-31 VITALS — BP 148/92 | HR 64 | Temp 95.9°F | Resp 18 | Wt 228.7 lb

## 2017-10-31 VITALS — BP 129/70 | HR 60 | Resp 18

## 2017-10-31 DIAGNOSIS — I10 Essential (primary) hypertension: Secondary | ICD-10-CM

## 2017-10-31 DIAGNOSIS — D751 Secondary polycythemia: Secondary | ICD-10-CM | POA: Diagnosis not present

## 2017-10-31 DIAGNOSIS — K5090 Crohn's disease, unspecified, without complications: Secondary | ICD-10-CM | POA: Insufficient documentation

## 2017-10-31 DIAGNOSIS — Z720 Tobacco use: Secondary | ICD-10-CM

## 2017-10-31 DIAGNOSIS — K50919 Crohn's disease, unspecified, with unspecified complications: Secondary | ICD-10-CM

## 2017-10-31 DIAGNOSIS — F17290 Nicotine dependence, other tobacco product, uncomplicated: Secondary | ICD-10-CM

## 2017-10-31 DIAGNOSIS — D582 Other hemoglobinopathies: Secondary | ICD-10-CM | POA: Insufficient documentation

## 2017-10-31 DIAGNOSIS — D72829 Elevated white blood cell count, unspecified: Secondary | ICD-10-CM

## 2017-10-31 DIAGNOSIS — Z79899 Other long term (current) drug therapy: Secondary | ICD-10-CM | POA: Diagnosis not present

## 2017-10-31 DIAGNOSIS — Z7952 Long term (current) use of systemic steroids: Secondary | ICD-10-CM

## 2017-10-31 DIAGNOSIS — K219 Gastro-esophageal reflux disease without esophagitis: Secondary | ICD-10-CM | POA: Insufficient documentation

## 2017-10-31 DIAGNOSIS — E785 Hyperlipidemia, unspecified: Secondary | ICD-10-CM | POA: Diagnosis not present

## 2017-10-31 LAB — CBC WITH DIFFERENTIAL/PLATELET
BASOS PCT: 1 %
Basophils Absolute: 0.1 10*3/uL (ref 0–0.1)
Eosinophils Absolute: 0.3 10*3/uL (ref 0–0.7)
Eosinophils Relative: 3 %
HEMATOCRIT: 52.3 % — AB (ref 40.0–52.0)
HEMOGLOBIN: 18.1 g/dL — AB (ref 13.0–18.0)
LYMPHS ABS: 3.6 10*3/uL (ref 1.0–3.6)
Lymphocytes Relative: 30 %
MCH: 32.5 pg (ref 26.0–34.0)
MCHC: 34.6 g/dL (ref 32.0–36.0)
MCV: 94.1 fL (ref 80.0–100.0)
MONO ABS: 1.3 10*3/uL — AB (ref 0.2–1.0)
MONOS PCT: 11 %
Neutro Abs: 6.9 10*3/uL — ABNORMAL HIGH (ref 1.4–6.5)
Neutrophils Relative %: 55 %
Platelets: 262 10*3/uL (ref 150–440)
RBC: 5.56 MIL/uL (ref 4.40–5.90)
RDW: 13.5 % (ref 11.5–14.5)
WBC: 12.3 10*3/uL — ABNORMAL HIGH (ref 3.8–10.6)

## 2017-10-31 NOTE — Progress Notes (Signed)
Unable to draw complete 552m phlebotomy today. 360 ml phlebotomy performed. MD, Dr. YTasia Catchings notified via telephone and aware. Per MD order: patient may be discharged to home at this time. Patient needs to return to clinic next week for an additional phlebotomy. Patient educated and verbalized understanding.

## 2017-10-31 NOTE — Progress Notes (Signed)
Here for follow up. Stated feeling " not bad at all "

## 2017-10-31 NOTE — Progress Notes (Signed)
Hematology/Oncology Follow Up Note [facility] Telephone:([phone] Fax:([phone]   Patient Care Team: [name], MD as PCP - General (Internal Medicine)  REFERRING PROVIDER: Dorian Furnace, Gastroenterology.  REASON FOR VISIT Follow up for treatment of  Evaluation of leukocytosis and elevated hemoglobin.   HISTORY OF PRESENTING ILLNESS:  Todd Shepherd 46 y.o.  male with PMH listed as below who was referred by gastroenterology to me for evaluation of persistent leukocytosis and elevated hemoglobin.  Extensive medical records from both [place] were reviewed and HemOnc related problems and patient's complaints are listed as following  1 Crohn's disease: he has been on Humira 57m [place] weekly and Budesonide 644mPO daily. His Surveillance colonoscopy was  done 07/02/17 and demonstrated an adenoma. Per GI, the TI contained some small ulcers without signs of bleeding and mucosa in between ulcers normal. The IC valve was open and not narrowed. It was noted that the exam was otherwise normal. Sveral biopsies were taken: the ileal ulcers were noted to have hte characteristics of healing mucosal injury. There was mild active chronic colitis at the ileocecal opening. The TI, cecum, ascending, tranverse, descending, sigmoid colon, and rectum did not have any colitis/pathologic change. There was a small lymphoid aggregate in the sigmoid colon. There was no dysplasia/malignancy.  2 Persistent leukocytosis: wbc  was 10.8 on 07/12/2017, 12.8 on 07/23/2017 and 13.9 on 08/28/2017, ANC 8.98. Basophil 0.13,   3 Persistent elevated hemoglobin: Hb was 19.4, Hct 55.5 on 07/12/2017, Hb was 12.8 Hct  52.6 on 07/23/2017, Hb was 18.6 and Hct was 52.8 on 08/28/2017  4 Patient reports that he was seen by physician 10-15 years ago due to elevated blood counts and lab work up was done. He can not remember details of his diagnosis. He did not get any phlebotomy or  medication for that condition. He has itchiness, which is chronic, intermittent, usually triggered after hot shower.  Denies any history of blood clot.     5 patient initially reported that he does not smoke cigarettes. After I got his preliminary lab work which showed high carbomonoxide level and I called him with further questioning he reports that he smokes cigar. He was advised to quit smoking cigars.  His home furnace was recently checked and there was no gas leak.  Interval history Patient presented for follow-up for the management of erythrocytosis/polycythemia. He received therapeutic phlebotomy 2 units during interval. He reports feeling the same. Denies any blurred vision, neurologic symptoms, chest pain or shortness of breath. He reports that he has cut down the numbers of cigars he smokes.  ROS:  Review of Systems  Constitutional: Negative for appetite change, chills and fever.  HENT:   Negative for hearing loss.   Eyes: Negative for eye problems.  Respiratory: Negative for chest tightness.   Cardiovascular: Negative for chest pain.  Gastrointestinal: Negative for abdominal distention.  Endocrine: Negative for hot flashes.  Genitourinary: Negative for difficulty urinating.   Musculoskeletal: Negative for arthralgias.  Skin: Positive for itching.  Neurological: Negative for dizziness.  Hematological: Negative for adenopathy.  Psychiatric/Behavioral: The patient is not nervous/anxious.     MEDICAL HISTORY:  Past Medical History:  Diagnosis Date  . Benign hypertension   . [place]  . GERD (gastroesophageal reflux disease)   . Hyperlipidemia     SURGICAL HISTORY: Past Surgical History:  Procedure Laterality Date  . colonoscopys     x6  . ESOPHAGOGASTRODUODENOSCOPY    .  TONSILLECTOMY      SOCIAL HISTORY: Social History   Socioeconomic History  . Marital status: Married    Spouse name: Not on file  . Number of children: Not on file  . Years of  education: Not on file  . Highest education level: Not on file  Social Needs  . Financial resource strain: Not on file  . Food insecurity - worry: Not on file  . Food insecurity - inability: Not on file  . Transportation needs - medical: Not on file  . Transportation needs - non-medical: Not on file  Occupational History  . Not on file  Tobacco Use  . Smoking status: Current Some Day Smoker    Types: Cigars  . Smokeless tobacco: Never Used  Substance and Sexual Activity  . Alcohol use: No  . Drug use: No  . Sexual activity: Not on file  Other Topics Concern  . Not on file  Social History Narrative  . Not on file    FAMILY HISTORY: Family History  Problem Relation Age of Onset  . Diabetes Mother   . Hypertension Father   . Hypertension Maternal Grandmother   . Diabetes Paternal Grandmother   . Hypertension Paternal Grandmother   . Anemia Paternal Grandfather     ALLERGIES:  is allergic to amoxicillin; levaquin [levofloxacin]; and mercaptopurine.  MEDICATIONS:  Current Outpatient Medications  Medication Sig Dispense Refill  . ACIDOPHILUS LACTOBACILLUS PO Take by mouth.     . Adalimumab (HUMIRA PEN) 40 MG/0.8ML PNKT Inject into the skin.    . budesonide (ENTOCORT EC) 3 MG 24 hr capsule Take 2 mg daily by mouth.     . Multiple Vitamin (MULTIVITAMIN) tablet Take 1 tablet by mouth daily.    . nebivolol (BYSTOLIC) 5 MG tablet Take 5 mg by mouth daily.    . Omega-3 Fatty Acids (OMEGA-3 FISH OIL PO) Take by mouth.    . pantoprazole (PROTONIX) 40 MG tablet Take 40 mg by mouth daily.     No current facility-administered medications for this visit.       [name]  PHYSICAL EXAMINATION: ECOG PERFORMANCE STATUS: 0 - Asymptomatic Vitals:   10/31/17 1437  BP: (!) 148/92  Pulse: 64  Resp: 18  Temp: (!) 95.9 F (35.5 C)   Filed Weights   10/31/17 1437  Weight: 228 lb 11.2 oz (103.7 kg)    GENERAL: No distress, well nourished.  SKIN:  No rashes or significant lesions    HEAD: Normocephalic, No masses, lesions, tenderness or abnormalities  EYES: Conjunctiva are pink, non icteric ENT: External ears normal ,lips , buccal mucosa, and tongue normal and mucous membranes are moist  LYMPH: No palpable cervical and axillary lymphadenopathy  LUNGS: Clear to auscultation, no crackles or wheezes HEART: Regular rate & rhythm, no murmurs, no gallops, S1 normal and S2 normal  ABDOMEN: Abdomen soft, non-tender, normal bowel sounds, I did not appreciate any  masses or organomegaly  MUSCULOSKELETAL: No CVA tenderness and no tenderness on percussion of the back or rib cage.  EXTREMITIES: No edema, no skin discoloration or tenderness. Facial Plethora. NEURO: Alert & oriented, no focal motor/sensory deficits.  [name]    LABORATORY DATA:  I have reviewed the data as listed Lab Results  Component Value Date   WBC 14.4 (H) 10/03/2017   HGB 18.2 (H) 10/03/2017   HCT 53.0 (H) 10/03/2017   MCV 94.1 10/03/2017   PLT 240 10/03/2017   Recent Labs    09/17/17 1525  NA 140  K 4.3  CL 106  CO2 26  GLUCOSE 104*  BUN 14  CREATININE 0.91  CALCIUM 9.2  GFRNONAA >60  GFRAA >60  PROT 7.2  ALBUMIN 4.2  AST 21  ALT 27  ALKPHOS 64  BILITOT 0.4   Surgical Pathology 07/02/2017 DIAGNOSIS:  A. ULCERS, ILEUM; COLD BIOPSY:  - SMALL BOWEL MUCOSA WITH FEATURES OF A HEALING MUCOSAL INJURY.  - NEGATIVE FOR DYSPLASIA AND MALIGNANCY.  B. TERMINAL ILEUM; COLD BIOPSY:  - NO PATHOLOGIC CHANGE.  C. ILEOCECAL OPENING; COLD BIOPSY:  - MILD CHRONIC ACTIVE ENTERITIS/COLITIS.  - NEGATIVE FOR DYSPLASIA AND MALIGNANCY.  D. COLON POLYP, CECUM; COLD BIOPSY:  - TUBULAR ADENOMA.  - NEGATIVE FOR HIGH-GRADE DYSPLASIA AND MALIGNANCY.  E. COLON, CECUM; COLD BIOPSY:  -NO PATHOLOGIC CHANGE.  F. ASCENDING COLON; COLD BIOPSY:  - NO PATHOLOGIC CHANGE.  G. TRANSVERSE COLON; COLD BIOPSY:  - NO PATHOLOGIC CHANGE.  H. DESCENDING COLON; COLD BIOPSY:  - NO PATHOLOGIC CHANGE.  I. SIGMOID COLON;  COLD BIOPSY:  - SMALL LYMPHOID AGGREGATE AND HEMORRHAGE.  - NEGATIVE FOR COLITIS, DYSPLASIA AND MALIGNANCY.  J. RECTUM; COLD BIOPSY:  - NO PATHOLOGIC CHANGE.    Negative for, Bcr-Abl, Jak2 exon 14 with reflex to CARL/MPL/Exon 12.  Normal and erythropoietin level at 8.4 Negative flow cytometry. ASSESSMENT & PLAN:  1. Erythrocytosis   2. Current chronic use of systemic steroids   3. Crohn's disease with complication, unspecified gastrointestinal tract location ([place])   4. Leukocytosis, unspecified type   5. Tobacco abuse    Polycythemia, primary versus secondary. His smoking habits definitely contribution to the erythrocytosis. Smoking cessation discussed with patient. Cessation program  I r information provided to patient. eferred patient to pulmonary for sleep apnea workup. He will get sleep apnea workup tomorrow. phlebotomy x 2 units today. Check abdomen ultrasound. If he does not have any sleep apnea and has quit smoking, hemoglobin continue to be elevated, we'll consider having CT scan to rule out underlying malignancy as well as bone marrow biopsy for further evaluation.  All questions were answered. The patient knows to call the clinic with any problems questions or concerns. Follow-up in 3 weeks with repeat labs/possible phlebotomy.Earlie Server, MD, PhD Hematology Oncology [facility] at [facility] Pager- [phone] 10/31/2017

## 2017-11-01 ENCOUNTER — Encounter: Payer: Self-pay | Admitting: Oncology

## 2017-11-01 ENCOUNTER — Encounter: Payer: Self-pay | Admitting: Internal Medicine

## 2017-11-01 DIAGNOSIS — G4719 Other hypersomnia: Secondary | ICD-10-CM

## 2017-11-05 ENCOUNTER — Telehealth: Payer: Self-pay | Admitting: *Deleted

## 2017-11-05 NOTE — Telephone Encounter (Signed)
Pt aware of results of sleep study. Recommendation: Auto-cpap with pressure of 5-20 cm H20. Patient is not ready for [place] to place order for cpap yet. He would like to wait and will contact office in a week. Nothing further needed.ss

## 2017-11-06 ENCOUNTER — Other Ambulatory Visit: Payer: Self-pay

## 2017-11-06 ENCOUNTER — Inpatient Hospital Stay: Payer: 59

## 2017-11-06 ENCOUNTER — Other Ambulatory Visit: Payer: Self-pay | Admitting: Oncology

## 2017-11-06 VITALS — BP 134/80 | HR 75 | Temp 97.5°F | Resp 20

## 2017-11-06 DIAGNOSIS — D751 Secondary polycythemia: Secondary | ICD-10-CM

## 2017-11-06 DIAGNOSIS — D582 Other hemoglobinopathies: Secondary | ICD-10-CM

## 2017-11-06 LAB — CBC WITH DIFFERENTIAL/PLATELET
BASOS ABS: 0.2 10*3/uL — AB (ref 0–0.1)
BASOS PCT: 2 %
EOS ABS: 0.3 10*3/uL (ref 0–0.7)
Eosinophils Relative: 3 %
HEMATOCRIT: 47.7 % (ref 40.0–52.0)
HEMOGLOBIN: 16.5 g/dL (ref 13.0–18.0)
Lymphocytes Relative: 27 %
Lymphs Abs: 2.9 10*3/uL (ref 1.0–3.6)
MCH: 32.5 pg (ref 26.0–34.0)
MCHC: 34.6 g/dL (ref 32.0–36.0)
MCV: 94 fL (ref 80.0–100.0)
Monocytes Absolute: 0.7 10*3/uL (ref 0.2–1.0)
Monocytes Relative: 6 %
NEUTROS ABS: 6.8 10*3/uL — AB (ref 1.4–6.5)
NEUTROS PCT: 62 %
Platelets: 259 10*3/uL (ref 150–440)
RBC: 5.08 MIL/uL (ref 4.40–5.90)
RDW: 13.2 % (ref 11.5–14.5)
WBC: 10.9 10*3/uL — AB (ref 3.8–10.6)

## 2017-11-07 ENCOUNTER — Ambulatory Visit: Payer: 59

## 2017-11-20 NOTE — Progress Notes (Signed)
Hematology/Oncology Follow Up Note [facility] Telephone:([phone] [phone]   Patient Care Team: [name], MD as PCP - General (Internal Medicine)  REFERRING PROVIDER: Dorian Furnace, Gastroenterology.  REASON FOR VISIT Follow up for treatment of  Evaluation of leukocytosis and elevated hemoglobin.   HISTORY OF PRESENTING ILLNESS:  Todd Shepherd 46 y.o.  male with PMH listed as below who was referred by gastroenterology to me for evaluation of persistent leukocytosis and elevated hemoglobin.  Extensive medical records from both [place] were reviewed and HemOnc related problems and patient's complaints are listed as following  1 Crohn's disease: he has been on Humira 56m [place] weekly and Budesonide 63mPO daily. His Surveillance colonoscopy was  done 07/02/17 and demonstrated an adenoma. Per GI, the TI contained some small ulcers without signs of bleeding and mucosa in between ulcers normal. The IC valve was open and not narrowed. It was noted that the exam was otherwise normal. Sveral biopsies were taken: the ileal ulcers were noted to have hte characteristics of healing mucosal injury. There was mild active chronic colitis at the ileocecal opening. The TI, cecum, ascending, tranverse, descending, sigmoid colon, and rectum did not have any colitis/pathologic change. There was a small lymphoid aggregate in the sigmoid colon. There was no dysplasia/malignancy.  2 Persistent leukocytosis: wbc  was 10.8 on 07/12/2017, 12.8 on 07/23/2017 and 13.9 on 08/28/2017, ANC 8.98. Basophil 0.13,   3 Persistent elevated hemoglobin: Hb was 19.4, Hct 55.5 on 07/12/2017, Hb was 12.8 Hct  52.6 on 07/23/2017, Hb was 18.6 and Hct was 52.8 on 08/28/2017  4 Patient reports that he was seen by physician 10-15 years ago due to elevated blood counts and lab work up was done. He can not remember details of his diagnosis. He did not get any phlebotomy or  medication for that condition. He has itchiness, which is chronic, intermittent, usually triggered after hot shower.  Denies any history of blood clot.     5 patient initially reported that he does not smoke cigarettes. After I got his preliminary lab work which showed high carbomonoxide level and I called him with further questioning he reports that he smokes cigar. He was advised to quit smoking cigars.  His home furnace was recently checked and there was no gas leak.  Interval history Patient presented for follow-up for the management of erythrocytosis/polycythemia. He had sleep apnea study done and was diagnosed with sleep apnea. He was advised to use CPAP machine which he declined. He has cut down cigar smoking for the past 2 months. Only smoked one cigar for the past week.  He reports feeling the same. Denies any blurred vision, neurologic symptoms, chest pain or shortness of breath.  ROS:  Review of Systems  Constitutional: Negative for appetite change, chills, fatigue and fever.  HENT:   Negative for hearing loss and lump/mass.   Eyes: Negative for eye problems.  Respiratory: Negative for chest tightness and cough.   Cardiovascular: Negative for chest pain and leg swelling.  Gastrointestinal: Negative for abdominal distention and abdominal pain.  Endocrine: Negative for hot flashes.  Genitourinary: Negative for difficulty urinating and dysuria.   Musculoskeletal: Negative for arthralgias, back pain and gait problem.  Skin: Negative for itching and rash.  Neurological: Negative for dizziness and gait problem.  Hematological: Negative for adenopathy. Does not bruise/bleed easily.  Psychiatric/Behavioral: Negative for confusion. The patient is not nervous/anxious.     MEDICAL HISTORY:  Past  Medical History:  Diagnosis Date  . Benign hypertension   . Crohn's disease ([place])   . GERD (gastroesophageal reflux disease)   . Hyperlipidemia     SURGICAL HISTORY: Past Surgical History:    Procedure Laterality Date  . COLONOSCOPY WITH PROPOFOL N/A 07/02/2017   Procedure: COLONOSCOPY WITH PROPOFOL;  Surgeon: Lollie Sails, MD;  Location: [facility] ENDOSCOPY;  Service: Endoscopy;  Laterality: N/A;  . colonoscopys     x6  . ESOPHAGOGASTRODUODENOSCOPY    . TONSILLECTOMY      SOCIAL HISTORY: Social History   Socioeconomic History  . Marital status: Married    Spouse name: Not on file  . Number of children: Not on file  . Years of education: Not on file  . Highest education level: Not on file  Social Needs  . Financial resource strain: Not on file  . Food insecurity - worry: Not on file  . Food insecurity - inability: Not on file  . Transportation needs - medical: Not on file  . Transportation needs - non-medical: Not on file  Occupational History  . Not on file  Tobacco Use  . Smoking status: Current Some Day Smoker    Types: Cigars  . Smokeless tobacco: Never Used  Substance and Sexual Activity  . Alcohol use: No  . Drug use: No  . Sexual activity: Not on file  Other Topics Concern  . Not on file  Social History Narrative  . Not on file    FAMILY HISTORY: Family History  Problem Relation Age of Onset  . Diabetes Mother   . Hypertension Father   . Hypertension Maternal Grandmother   . Diabetes Paternal Grandmother   . Hypertension Paternal Grandmother   . Anemia Paternal Grandfather     ALLERGIES:  is allergic to amoxicillin; levaquin [levofloxacin]; and mercaptopurine.  MEDICATIONS:  Current Outpatient Medications  Medication Sig Dispense Refill  . ACIDOPHILUS LACTOBACILLUS PO Take by mouth.     . Adalimumab (HUMIRA PEN) 40 MG/0.8ML PNKT Inject into the skin.    . budesonide (ENTOCORT EC) 3 MG 24 hr capsule Take 2 mg daily by mouth.     . Multiple Vitamin (MULTIVITAMIN) tablet Take 1 tablet by mouth daily.    . nebivolol (BYSTOLIC) 5 MG tablet Take 5 mg by mouth daily.    . Omega-3 Fatty Acids (OMEGA-3 FISH OIL PO) Take by mouth.    .  pantoprazole (PROTONIX) 40 MG tablet Take 40 mg by mouth daily.     No current facility-administered medications for this visit.       [name]  PHYSICAL EXAMINATION: ECOG PERFORMANCE STATUS: 0 - Asymptomatic Vitals:   11/21/17 1450  BP: (!) 145/74  Pulse: 65  Temp: 98.3 F (36.8 C)   Filed Weights   11/21/17 1450  Weight: 227 lb (103 kg)   Physical Exam  Constitutional: He is oriented to person, place, and time. He appears well-nourished. No distress.  HENT:  Head: Normocephalic and atraumatic.  Eyes: EOM are normal. Pupils are equal, round, and reactive to light. Left eye exhibits no discharge.  Neck: Neck supple.  Cardiovascular: Normal rate and regular rhythm.  No murmur heard. Pulmonary/Chest: Effort normal. No respiratory distress.  Abdominal: Soft. Bowel sounds are normal. He exhibits no distension. There is no tenderness.  Musculoskeletal: He exhibits no edema.  Neurological: He is alert and oriented to person, place, and time. No cranial nerve deficit.  Skin: Skin is warm and dry.  Facial plethora  Psychiatric: He has  a normal mood and affect.   LABORATORY DATA:  I have reviewed the data as listed Lab Results  Component Value Date   WBC 12.6 (H) 11/21/2017   HGB 16.6 11/21/2017   HCT 48.5 11/21/2017   MCV 95.1 11/21/2017   PLT 287 11/21/2017   Recent Labs    09/17/17 1525  NA 140  K 4.3  CL 106  CO2 26  GLUCOSE 104*  BUN 14  CREATININE 0.91  CALCIUM 9.2  GFRNONAA >60  GFRAA >60  PROT 7.2  ALBUMIN 4.2  AST 21  ALT 27  ALKPHOS 64  BILITOT 0.4   Surgical Pathology 07/02/2017 DIAGNOSIS:  A. ULCERS, ILEUM; COLD BIOPSY:  - SMALL BOWEL MUCOSA WITH FEATURES OF A HEALING MUCOSAL INJURY.  - NEGATIVE FOR DYSPLASIA AND MALIGNANCY.  B. TERMINAL ILEUM; COLD BIOPSY:  - NO PATHOLOGIC CHANGE.  C. ILEOCECAL OPENING; COLD BIOPSY:  - MILD CHRONIC ACTIVE ENTERITIS/COLITIS.  - NEGATIVE FOR DYSPLASIA AND MALIGNANCY.  D. COLON POLYP, CECUM; COLD BIOPSY:  -  TUBULAR ADENOMA.  - NEGATIVE FOR HIGH-GRADE DYSPLASIA AND MALIGNANCY.  E. COLON, CECUM; COLD BIOPSY:  -NO PATHOLOGIC CHANGE.  F. ASCENDING COLON; COLD BIOPSY:  - NO PATHOLOGIC CHANGE.  G. TRANSVERSE COLON; COLD BIOPSY:  - NO PATHOLOGIC CHANGE.  H. DESCENDING COLON; COLD BIOPSY:  - NO PATHOLOGIC CHANGE.  I. SIGMOID COLON; COLD BIOPSY:  - SMALL LYMPHOID AGGREGATE AND HEMORRHAGE.  - NEGATIVE FOR COLITIS, DYSPLASIA AND MALIGNANCY.  J. RECTUM; COLD BIOPSY:  - NO PATHOLOGIC CHANGE.    Negative for, Bcr-Abl, Jak2 exon 14 with reflex to CARL/MPL/Exon 12.  Normal and erythropoietin level at 8.4 Negative flow cytometry.  ASSESSMENT & PLAN:  1. Secondary polycythemia   2. Leukocytosis, unspecified type    Polycythemia,secondary. Likely combinaiton due to smoking and sleep apnea.  Hb improves with recent decreased cigar consumption.  Leukocytosis likely secondary to smoking and inflammatory bowel disease.  Continue monitoring. All questions were answered. The patient knows to call the clinic with any problems questions or concerns. Follow-up in 3 months with repeat labs/possible phlebotomy.Earlie Server, MD, PhD Hematology Oncology [facility] at [facility] Pager- [phone] 11/21/2017

## 2017-11-21 ENCOUNTER — Encounter: Payer: Self-pay | Admitting: Oncology

## 2017-11-21 ENCOUNTER — Inpatient Hospital Stay: Payer: 59 | Attending: Oncology

## 2017-11-21 ENCOUNTER — Inpatient Hospital Stay (HOSPITAL_BASED_OUTPATIENT_CLINIC_OR_DEPARTMENT_OTHER): Payer: 59 | Admitting: Oncology

## 2017-11-21 ENCOUNTER — Inpatient Hospital Stay: Payer: 59

## 2017-11-21 VITALS — BP 145/74 | HR 65 | Temp 98.3°F | Wt 227.0 lb

## 2017-11-21 DIAGNOSIS — I10 Essential (primary) hypertension: Secondary | ICD-10-CM | POA: Insufficient documentation

## 2017-11-21 DIAGNOSIS — K219 Gastro-esophageal reflux disease without esophagitis: Secondary | ICD-10-CM

## 2017-11-21 DIAGNOSIS — E785 Hyperlipidemia, unspecified: Secondary | ICD-10-CM | POA: Insufficient documentation

## 2017-11-21 DIAGNOSIS — G4730 Sleep apnea, unspecified: Secondary | ICD-10-CM

## 2017-11-21 DIAGNOSIS — F17210 Nicotine dependence, cigarettes, uncomplicated: Secondary | ICD-10-CM | POA: Diagnosis not present

## 2017-11-21 DIAGNOSIS — D72829 Elevated white blood cell count, unspecified: Secondary | ICD-10-CM | POA: Diagnosis not present

## 2017-11-21 DIAGNOSIS — F17290 Nicotine dependence, other tobacco product, uncomplicated: Secondary | ICD-10-CM | POA: Diagnosis not present

## 2017-11-21 DIAGNOSIS — K5090 Crohn's disease, unspecified, without complications: Secondary | ICD-10-CM | POA: Insufficient documentation

## 2017-11-21 DIAGNOSIS — D751 Secondary polycythemia: Secondary | ICD-10-CM

## 2017-11-21 LAB — CBC WITH DIFFERENTIAL/PLATELET
BASOS PCT: 1 %
Basophils Absolute: 0.2 10*3/uL — ABNORMAL HIGH (ref 0–0.1)
EOS ABS: 0.3 10*3/uL (ref 0–0.7)
EOS PCT: 2 %
HCT: 48.5 % (ref 40.0–52.0)
HEMOGLOBIN: 16.6 g/dL (ref 13.0–18.0)
LYMPHS ABS: 2.6 10*3/uL (ref 1.0–3.6)
Lymphocytes Relative: 21 %
MCH: 32.6 pg (ref 26.0–34.0)
MCHC: 34.3 g/dL (ref 32.0–36.0)
MCV: 95.1 fL (ref 80.0–100.0)
MONOS PCT: 8 %
Monocytes Absolute: 1 10*3/uL (ref 0.2–1.0)
NEUTROS PCT: 68 %
Neutro Abs: 8.6 10*3/uL — ABNORMAL HIGH (ref 1.4–6.5)
PLATELETS: 287 10*3/uL (ref 150–440)
RBC: 5.1 MIL/uL (ref 4.40–5.90)
RDW: 13.8 % (ref 11.5–14.5)
WBC: 12.6 10*3/uL — ABNORMAL HIGH (ref 3.8–10.6)

## 2017-11-21 NOTE — Progress Notes (Signed)
Patient here for follow up with labs today and possible phlebotomy. He states that he is feeling well and denies any pain.

## 2018-01-18 DIAGNOSIS — I10 Essential (primary) hypertension: Secondary | ICD-10-CM | POA: Diagnosis not present

## 2018-01-18 DIAGNOSIS — D751 Secondary polycythemia: Secondary | ICD-10-CM | POA: Diagnosis not present

## 2018-01-24 DIAGNOSIS — K5090 Crohn's disease, unspecified, without complications: Secondary | ICD-10-CM | POA: Diagnosis not present

## 2018-01-24 DIAGNOSIS — I10 Essential (primary) hypertension: Secondary | ICD-10-CM | POA: Diagnosis not present

## 2018-01-24 DIAGNOSIS — R748 Abnormal levels of other serum enzymes: Secondary | ICD-10-CM | POA: Diagnosis not present

## 2018-01-24 DIAGNOSIS — Z0000 Encounter for general adult medical examination without abnormal findings: Secondary | ICD-10-CM | POA: Diagnosis not present

## 2018-02-20 ENCOUNTER — Other Ambulatory Visit: Payer: Self-pay

## 2018-02-20 ENCOUNTER — Encounter: Payer: Self-pay | Admitting: Oncology

## 2018-02-20 ENCOUNTER — Inpatient Hospital Stay: Payer: 59 | Attending: Oncology | Admitting: *Deleted

## 2018-02-20 ENCOUNTER — Inpatient Hospital Stay: Payer: 59

## 2018-02-20 ENCOUNTER — Inpatient Hospital Stay (HOSPITAL_BASED_OUTPATIENT_CLINIC_OR_DEPARTMENT_OTHER): Payer: 59 | Admitting: Oncology

## 2018-02-20 VITALS — BP 133/82 | HR 79 | Temp 98.0°F | Resp 12 | Ht 73.0 in | Wt 230.2 lb

## 2018-02-20 VITALS — BP 115/75 | HR 75

## 2018-02-20 DIAGNOSIS — Z720 Tobacco use: Secondary | ICD-10-CM

## 2018-02-20 DIAGNOSIS — D751 Secondary polycythemia: Secondary | ICD-10-CM

## 2018-02-20 DIAGNOSIS — G4730 Sleep apnea, unspecified: Secondary | ICD-10-CM | POA: Diagnosis not present

## 2018-02-20 DIAGNOSIS — K5190 Ulcerative colitis, unspecified, without complications: Secondary | ICD-10-CM

## 2018-02-20 DIAGNOSIS — D72829 Elevated white blood cell count, unspecified: Secondary | ICD-10-CM

## 2018-02-20 DIAGNOSIS — Z7952 Long term (current) use of systemic steroids: Secondary | ICD-10-CM

## 2018-02-20 DIAGNOSIS — D582 Other hemoglobinopathies: Secondary | ICD-10-CM

## 2018-02-20 LAB — CBC WITH DIFFERENTIAL/PLATELET
BASOS ABS: 0.2 10*3/uL — AB (ref 0–0.1)
BASOS PCT: 1 %
EOS PCT: 2 %
Eosinophils Absolute: 0.3 10*3/uL (ref 0–0.7)
HCT: 52.6 % — ABNORMAL HIGH (ref 40.0–52.0)
Hemoglobin: 17.8 g/dL (ref 13.0–18.0)
Lymphocytes Relative: 20 %
Lymphs Abs: 2.9 10*3/uL (ref 1.0–3.6)
MCH: 31.1 pg (ref 26.0–34.0)
MCHC: 33.8 g/dL (ref 32.0–36.0)
MCV: 91.9 fL (ref 80.0–100.0)
Monocytes Absolute: 0.9 10*3/uL (ref 0.2–1.0)
Monocytes Relative: 6 %
Neutro Abs: 10.2 10*3/uL — ABNORMAL HIGH (ref 1.4–6.5)
Neutrophils Relative %: 71 %
PLATELETS: 278 10*3/uL (ref 150–440)
RBC: 5.72 MIL/uL (ref 4.40–5.90)
RDW: 13.6 % (ref 11.5–14.5)
WBC: 14.5 10*3/uL — ABNORMAL HIGH (ref 3.8–10.6)

## 2018-02-20 NOTE — Progress Notes (Signed)
Hematology/Oncology Follow Up Note [name] Telephone:([phone] [phone]   Patient Care Team: [name], MD as PCP - General (Internal Medicine)  REFERRING PROVIDER: Dorian Furnace, Gastroenterology.  REASON FOR VISIT Follow up for treatment of secondary polycythemia.   HISTORY OF PRESENTING ILLNESS:  Todd Shepherd 47 y.o.  male with PMH listed as below who was referred by gastroenterology to me for evaluation of persistent leukocytosis and elevated hemoglobin.  Extensive medical records from both [place] were reviewed and HemOnc related problems and patient's complaints are listed as following  1 Crohn's disease: he has been on Humira 28m [place] weekly and Budesonide 641mPO daily. His Surveillance colonoscopy was  done 07/02/17 and demonstrated an adenoma. Per GI, the TI contained some small ulcers without signs of bleeding and mucosa in between ulcers normal. The IC valve was open and not narrowed. It was noted that the exam was otherwise normal. Sveral biopsies were taken: the ileal ulcers were noted to have hte characteristics of healing mucosal injury. There was mild active chronic colitis at the ileocecal opening. The TI, cecum, ascending, tranverse, descending, sigmoid colon, and rectum did not have any colitis/pathologic change. There was a small lymphoid aggregate in the sigmoid colon. There was no dysplasia/malignancy.  2 Persistent leukocytosis: wbc  was 10.8 on 07/12/2017, 12.8 on 07/23/2017 and 13.9 on 08/28/2017, ANC 8.98. Basophil 0.13,   3 Persistent elevated hemoglobin: Hb was 19.4, Hct 55.5 on 07/12/2017, Hb was 12.8 Hct  52.6 on 07/23/2017, Hb was 18.6 and Hct was 52.8 on 08/28/2017  4 Patient reports that he was seen by physician 10-15 years ago due to elevated blood counts and lab work up was done. He can not remember details of his diagnosis. He did not get any phlebotomy or medication for that condition. He  has itchiness, which is chronic, intermittent, usually triggered after hot shower.  Denies any history of blood clot.     5 patient initially reported that he does not smoke cigarettes. After I got his preliminary lab work which showed high carbomonoxide level and I called him with further questioning he reports that he smokes cigar. He was advised to quit smoking cigars.  His home furnace was recently checked and there was no gas leak.  Interval history Patient presented for follow-up for the management of erythrocytosis/polycythemia. He had sleep apnea study done and was diagnosed with sleep apnea. He was advised to use CPAP machine which he declined. He continues to smoke cigar. Denies any blurred vision, neurologic symptoms, chest pain or shortness of breath. His inflammatory bowel disease is controlled well with humira and steroid.   ROS:  Review of Systems  Constitutional: Negative for appetite change, chills, fatigue and fever.  HENT:   Negative for hearing loss and lump/mass.   Eyes: Negative for eye problems.  Respiratory: Negative for chest tightness and cough.   Cardiovascular: Negative for chest pain and leg swelling.  Gastrointestinal: Negative for abdominal distention and abdominal pain.  Endocrine: Negative for hot flashes.  Genitourinary: Negative for difficulty urinating and dysuria.   Musculoskeletal: Negative for arthralgias, back pain and gait problem.  Skin: Negative for itching and rash.  Neurological: Negative for dizziness and gait problem.  Hematological: Negative for adenopathy. Does not bruise/bleed easily.  Psychiatric/Behavioral: Negative for confusion. The patient is not nervous/anxious.     MEDICAL HISTORY:  Past Medical History:  Diagnosis Date  . Benign hypertension   . Crohn's  disease ([place])   . GERD (gastroesophageal reflux disease)   . Hyperlipidemia     SURGICAL HISTORY: Past Surgical History:  Procedure Laterality Date  . COLONOSCOPY WITH  PROPOFOL N/A 07/02/2017   Procedure: COLONOSCOPY WITH PROPOFOL;  Surgeon: Lollie Sails, MD;  Location: [facility] ENDOSCOPY;  Service: Endoscopy;  Laterality: N/A;  . colonoscopys     x6  . ESOPHAGOGASTRODUODENOSCOPY    . TONSILLECTOMY      SOCIAL HISTORY: Social History   Socioeconomic History  . Marital status: Married    Spouse name: Not on file  . Number of children: Not on file  . Years of education: Not on file  . Highest education level: Not on file  Social Needs  . Financial resource strain: Not on file  . Food insecurity - worry: Not on file  . Food insecurity - inability: Not on file  . Transportation needs - medical: Not on file  . Transportation needs - non-medical: Not on file  Occupational History  . Not on file  Tobacco Use  . Smoking status: Current Some Day Smoker    Types: Cigars  . Smokeless tobacco: Never Used  Substance and Sexual Activity  . Alcohol use: No  . Drug use: No  . Sexual activity: Not on file  Other Topics Concern  . Not on file  Social History Narrative  . Not on file    FAMILY HISTORY: Family History  Problem Relation Age of Onset  . Diabetes Mother   . Hypertension Father   . Hypertension Maternal Grandmother   . Diabetes Paternal Grandmother   . Hypertension Paternal Grandmother   . Anemia Paternal Grandfather     ALLERGIES:  is allergic to amoxicillin; levaquin [levofloxacin]; and mercaptopurine.  MEDICATIONS:  Current Outpatient Medications  Medication Sig Dispense Refill  . ACIDOPHILUS LACTOBACILLUS PO Take by mouth.     . Adalimumab (HUMIRA PEN) 40 MG/0.8ML PNKT Inject into the skin.    . budesonide (ENTOCORT EC) 3 MG 24 hr capsule Take 2 mg daily by mouth.     . Multiple Vitamin (MULTIVITAMIN) tablet Take 1 tablet by mouth daily.    . nebivolol (BYSTOLIC) 5 MG tablet Take 5 mg by mouth daily.    . Omega-3 Fatty Acids (OMEGA-3 FISH OIL PO) Take by mouth.    . pantoprazole (PROTONIX) 40 MG tablet Take 40 mg by mouth  daily.     No current facility-administered medications for this visit.       [name]  PHYSICAL EXAMINATION: ECOG PERFORMANCE STATUS: 0 - Asymptomatic Vitals:   02/20/18 1357 02/20/18 1402  BP:  133/82  Pulse:  79  Resp: 12   Temp:  98 F (36.7 C)   Filed Weights   02/20/18 1357  Weight: 230 lb 3.2 oz (104.4 kg)   Physical Exam  Constitutional: He is oriented to person, place, and time. He appears well-nourished. No distress.  HENT:  Head: Normocephalic and atraumatic.  Mouth/Throat: No oropharyngeal exudate.  Eyes: EOM are normal. Pupils are equal, round, and reactive to light. Left eye exhibits no discharge.  Neck: Neck supple.  Cardiovascular: Normal rate and regular rhythm. Exam reveals no friction rub.  No murmur heard. Pulmonary/Chest: Effort normal. No stridor. No respiratory distress. He has no wheezes.  Abdominal: Soft. Bowel sounds are normal. He exhibits no distension and no mass. There is no tenderness.  Musculoskeletal: He exhibits no edema.  Lymphadenopathy:    He has no cervical adenopathy.  Neurological: He is alert  and oriented to person, place, and time. No cranial nerve deficit. Coordination normal.  Skin: Skin is warm and dry. No erythema.  Facial plethora  Psychiatric: He has a normal mood and affect.   LABORATORY DATA:  I have reviewed the data as listed Lab Results  Component Value Date   WBC 14.5 (H) 02/20/2018   HGB 17.8 02/20/2018   HCT 52.6 (H) 02/20/2018   MCV 91.9 02/20/2018   PLT 278 02/20/2018   Recent Labs    09/17/17 1525  NA 140  K 4.3  CL 106  CO2 26  GLUCOSE 104*  BUN 14  CREATININE 0.91  CALCIUM 9.2  GFRNONAA >60  GFRAA >60  PROT 7.2  ALBUMIN 4.2  AST 21  ALT 27  ALKPHOS 64  BILITOT 0.4   Surgical Pathology 07/02/2017 DIAGNOSIS:  A. ULCERS, ILEUM; COLD BIOPSY:  - SMALL BOWEL MUCOSA WITH FEATURES OF A HEALING MUCOSAL INJURY.  - NEGATIVE FOR DYSPLASIA AND MALIGNANCY.  B. TERMINAL ILEUM; COLD BIOPSY:  - NO  PATHOLOGIC CHANGE.  C. ILEOCECAL OPENING; COLD BIOPSY:  - MILD CHRONIC ACTIVE ENTERITIS/COLITIS.  - NEGATIVE FOR DYSPLASIA AND MALIGNANCY.  D. COLON POLYP, CECUM; COLD BIOPSY:  - TUBULAR ADENOMA.  - NEGATIVE FOR HIGH-GRADE DYSPLASIA AND MALIGNANCY.  E. COLON, CECUM; COLD BIOPSY:  -NO PATHOLOGIC CHANGE.  F. ASCENDING COLON; COLD BIOPSY:  - NO PATHOLOGIC CHANGE.  G. TRANSVERSE COLON; COLD BIOPSY:  - NO PATHOLOGIC CHANGE.  H. DESCENDING COLON; COLD BIOPSY:  - NO PATHOLOGIC CHANGE.  I. SIGMOID COLON; COLD BIOPSY:  - SMALL LYMPHOID AGGREGATE AND HEMORRHAGE.  - NEGATIVE FOR COLITIS, DYSPLASIA AND MALIGNANCY.  J. RECTUM; COLD BIOPSY:  - NO PATHOLOGIC CHANGE.    Negative for, Bcr-Abl, Jak2 exon 14 with reflex to CARL/MPL/Exon 12.  Normal and erythropoietin level at 8.4 Negative flow cytometry.  ASSESSMENT & PLAN:  1. Secondary polycythemia   2. Leukocytosis, unspecified type   3. Current chronic use of systemic steroids    Polycythemia,secondary. Likely combinaiton due to smoking and sleep apnea.  Hct increased to 52.6, will proceed with phlebotomy 518m.  Leukocytosis likely secondary to smoking and inflammatory bowel disease/steroid use.  Continue monitoring. Smoking cessation discussed with patient.  All questions were answered. The patient knows to call the clinic with any problems questions or concerns. Follow-up in 3 months with repeat labs/possible phlebotomy..Earlie Server MD, PhD Hematology Oncology [facility]at [facility] Digestive HealthPager- [phone]/05/2018

## 2018-02-20 NOTE — Progress Notes (Signed)
No changes since last appt.

## 2018-04-16 DIAGNOSIS — D582 Other hemoglobinopathies: Secondary | ICD-10-CM | POA: Diagnosis not present

## 2018-04-16 DIAGNOSIS — K5090 Crohn's disease, unspecified, without complications: Secondary | ICD-10-CM | POA: Diagnosis not present

## 2018-04-16 DIAGNOSIS — E782 Mixed hyperlipidemia: Secondary | ICD-10-CM | POA: Diagnosis not present

## 2018-04-16 DIAGNOSIS — R748 Abnormal levels of other serum enzymes: Secondary | ICD-10-CM | POA: Diagnosis not present

## 2018-04-23 DIAGNOSIS — K5090 Crohn's disease, unspecified, without complications: Secondary | ICD-10-CM | POA: Diagnosis not present

## 2018-04-23 DIAGNOSIS — I10 Essential (primary) hypertension: Secondary | ICD-10-CM | POA: Diagnosis not present

## 2018-04-23 DIAGNOSIS — K219 Gastro-esophageal reflux disease without esophagitis: Secondary | ICD-10-CM | POA: Diagnosis not present

## 2018-06-05 ENCOUNTER — Other Ambulatory Visit: Payer: 59

## 2018-06-05 ENCOUNTER — Ambulatory Visit: Payer: 59 | Admitting: Oncology

## 2018-09-18 DIAGNOSIS — D582 Other hemoglobinopathies: Secondary | ICD-10-CM | POA: Diagnosis not present

## 2018-09-18 DIAGNOSIS — E782 Mixed hyperlipidemia: Secondary | ICD-10-CM | POA: Diagnosis not present

## 2018-09-18 DIAGNOSIS — I10 Essential (primary) hypertension: Secondary | ICD-10-CM | POA: Diagnosis not present

## 2018-10-03 DIAGNOSIS — G4733 Obstructive sleep apnea (adult) (pediatric): Secondary | ICD-10-CM | POA: Diagnosis not present

## 2018-10-03 DIAGNOSIS — K5090 Crohn's disease, unspecified, without complications: Secondary | ICD-10-CM | POA: Diagnosis not present

## 2018-10-03 DIAGNOSIS — I10 Essential (primary) hypertension: Secondary | ICD-10-CM | POA: Diagnosis not present

## 2019-01-09 DIAGNOSIS — K5000 Crohn's disease of small intestine without complications: Secondary | ICD-10-CM | POA: Diagnosis not present

## 2019-01-16 DIAGNOSIS — K5000 Crohn's disease of small intestine without complications: Secondary | ICD-10-CM | POA: Diagnosis not present

## 2019-10-03 ENCOUNTER — Other Ambulatory Visit: Payer: Self-pay

## 2019-10-03 ENCOUNTER — Inpatient Hospital Stay: Payer: 59 | Attending: Oncology

## 2019-10-03 DIAGNOSIS — D751 Secondary polycythemia: Secondary | ICD-10-CM

## 2019-10-03 DIAGNOSIS — E785 Hyperlipidemia, unspecified: Secondary | ICD-10-CM | POA: Diagnosis not present

## 2019-10-03 DIAGNOSIS — G4730 Sleep apnea, unspecified: Secondary | ICD-10-CM | POA: Diagnosis not present

## 2019-10-03 DIAGNOSIS — I10 Essential (primary) hypertension: Secondary | ICD-10-CM | POA: Insufficient documentation

## 2019-10-03 DIAGNOSIS — F17290 Nicotine dependence, other tobacco product, uncomplicated: Secondary | ICD-10-CM | POA: Insufficient documentation

## 2019-10-03 DIAGNOSIS — Z79899 Other long term (current) drug therapy: Secondary | ICD-10-CM | POA: Insufficient documentation

## 2019-10-03 DIAGNOSIS — K5090 Crohn's disease, unspecified, without complications: Secondary | ICD-10-CM | POA: Insufficient documentation

## 2019-10-03 LAB — CBC WITH DIFFERENTIAL/PLATELET
Abs Immature Granulocytes: 0.06 10*3/uL (ref 0.00–0.07)
Basophils Absolute: 0.1 10*3/uL (ref 0.0–0.1)
Basophils Relative: 1 %
Eosinophils Absolute: 0.3 10*3/uL (ref 0.0–0.5)
Eosinophils Relative: 2 %
HCT: 48.3 % (ref 39.0–52.0)
Hemoglobin: 17.1 g/dL — ABNORMAL HIGH (ref 13.0–17.0)
Immature Granulocytes: 1 %
Lymphocytes Relative: 26 %
Lymphs Abs: 3.4 10*3/uL (ref 0.7–4.0)
MCH: 32.4 pg (ref 26.0–34.0)
MCHC: 35.4 g/dL (ref 30.0–36.0)
MCV: 91.5 fL (ref 80.0–100.0)
Monocytes Absolute: 1.1 10*3/uL — ABNORMAL HIGH (ref 0.1–1.0)
Monocytes Relative: 9 %
Neutro Abs: 8.2 10*3/uL — ABNORMAL HIGH (ref 1.7–7.7)
Neutrophils Relative %: 61 %
Platelets: 254 10*3/uL (ref 150–400)
RBC: 5.28 MIL/uL (ref 4.22–5.81)
RDW: 12.8 % (ref 11.5–15.5)
WBC: 13.1 10*3/uL — ABNORMAL HIGH (ref 4.0–10.5)
nRBC: 0 % (ref 0.0–0.2)

## 2019-10-03 LAB — COMPREHENSIVE METABOLIC PANEL
ALT: 31 U/L (ref 0–44)
AST: 19 U/L (ref 15–41)
Albumin: 3.9 g/dL (ref 3.5–5.0)
Alkaline Phosphatase: 57 U/L (ref 38–126)
Anion gap: 7 (ref 5–15)
BUN: 13 mg/dL (ref 6–20)
CO2: 24 mmol/L (ref 22–32)
Calcium: 8.7 mg/dL — ABNORMAL LOW (ref 8.9–10.3)
Chloride: 108 mmol/L (ref 98–111)
Creatinine, Ser: 0.94 mg/dL (ref 0.61–1.24)
GFR calc Af Amer: >60 mL/min (ref >60)
GFR calc non Af Amer: >60 mL/min (ref >60)
Glucose, Bld: 98 mg/dL (ref 70–99)
Potassium: 3.9 mmol/L (ref 3.5–5.1)
Sodium: 139 mmol/L (ref 135–145)
Total Bilirubin: 0.5 mg/dL (ref 0.3–1.2)
Total Protein: 6.9 g/dL (ref 6.5–8.1)

## 2019-10-03 NOTE — Progress Notes (Signed)
Patient pre screened for office appointment, no questions or concerns today. 

## 2019-10-06 ENCOUNTER — Inpatient Hospital Stay: Payer: 59 | Admitting: Oncology

## 2019-10-06 ENCOUNTER — Encounter: Payer: Self-pay | Admitting: Oncology

## 2019-10-06 ENCOUNTER — Inpatient Hospital Stay: Payer: 59

## 2019-10-06 ENCOUNTER — Other Ambulatory Visit: Payer: Self-pay

## 2019-10-06 VITALS — BP 129/85 | HR 64 | Temp 97.4°F | Resp 18 | Wt 238.0 lb

## 2019-10-06 DIAGNOSIS — D751 Secondary polycythemia: Secondary | ICD-10-CM

## 2019-10-06 DIAGNOSIS — Z720 Tobacco use: Secondary | ICD-10-CM | POA: Diagnosis not present

## 2019-10-06 NOTE — Progress Notes (Signed)
Hematology/Oncology Follow Up Note [facility] Telephone:([phone] [phone]   Patient Care Team: [name], MD as PCP - General (Internal Medicine)  REFERRING PROVIDER: Dorian Furnace, Gastroenterology.  REASON FOR VISIT Follow up for treatment of secondary polycythemia.   HISTORY OF PRESENTING ILLNESS:  Todd Shepherd 48 y.o.  male with PMH listed as below who was referred by gastroenterology to me for evaluation of persistent leukocytosis and elevated hemoglobin.  Extensive medical records from both [place] were reviewed and HemOnc related problems and patient's complaints are listed as following  1 Crohn's disease: he has been on Humira 27m  weekly and Budesonide 679mPO daily. His Surveillance colonoscopy was  done 07/02/17 and demonstrated an adenoma. Per GI, the TI contained some small ulcers without signs of bleeding and mucosa in between ulcers normal. The IC valve was open and not narrowed. It was noted that the exam was otherwise normal. Sveral biopsies were taken: the ileal ulcers were noted to have hte characteristics of healing mucosal injury. There was mild active chronic colitis at the ileocecal opening. The TI, cecum, ascending, tranverse, descending, sigmoid colon, and rectum did not have any colitis/pathologic change. There was a small lymphoid aggregate in the sigmoid colon. There was no dysplasia/malignancy.  2 Persistent leukocytosis: wbc  was 10.8 on 07/12/2017, 12.8 on 07/23/2017 and 13.9 on 08/28/2017, ANC 8.98. Basophil 0.13,   3 Persistent elevated hemoglobin: Hb was 19.4, Hct 55.5 on 07/12/2017, Hb was 12.8 Hct  52.6 on 07/23/2017, Hb was 18.6 and Hct was 52.8 on 08/28/2017  4 Patient reports that he was seen by physician 10-15 years ago due to elevated blood counts and lab work up was done. He can not remember details of his diagnosis. He did not get any phlebotomy or medication for that condition. He  has itchiness, which is chronic, intermittent, usually triggered after hot shower.  Denies any history of blood clot.     5 patient initially reported that he does not smoke cigarettes. After I got his preliminary lab work which showed high carbomonoxide level and I called him with further questioning he reports that he smokes cigar. He was advised to quit smoking cigars.  His home furnace was recently checked and there was no gas leak.  Interval history [age].o. male presented for follow-up for the management of erythrocytosis/polycythemia. Patient has not been started on sleep apnea treatment with CPAP.  Per note, he was advised to use CPAP and he declined. He continues to smoke cigars.  But recently reporting that he has decreased smoking. Otherwise doing well. Inflammatory bowel disease is controlled with manner and the steroids. Denies weight loss, fever, chills, fatigue, night sweats.  Denies fever, chills, nausea, vomiting, diarrhea, chest pain, shortness of breath, abdominal pain, urinary symptoms, lower extremity swelling.    ROS:  Review of Systems  Constitutional: Negative for appetite change, chills, fatigue, fever and unexpected weight change.  HENT:   Negative for hearing loss, lump/mass and voice change.   Eyes: Negative for eye problems and icterus.  Respiratory: Negative for chest tightness, cough and shortness of breath.   Cardiovascular: Negative for chest pain and leg swelling.  Gastrointestinal: Negative for abdominal distention and abdominal pain.  Endocrine: Negative for hot flashes.  Genitourinary: Negative for difficulty urinating, dysuria and frequency.   Musculoskeletal: Negative for arthralgias, back pain and gait problem.  Skin: Negative for itching and rash.  Neurological: Negative for dizziness, gait problem,  light-headedness and numbness.  Hematological: Negative for adenopathy. Does not bruise/bleed easily.  Psychiatric/Behavioral: Negative for confusion.  The patient is not nervous/anxious.     MEDICAL HISTORY:  Past Medical History:  Diagnosis Date  . Atypical nevus x 2 11/03/2016   Right Abd Sup-Moderate(punch), and Right Abd Inf-Mild  . Benign hypertension   . Crohn's disease ([place])   . GERD (gastroesophageal reflux disease)   . Hyperlipidemia     SURGICAL HISTORY: Past Surgical History:  Procedure Laterality Date  . COLONOSCOPY WITH PROPOFOL N/A 07/02/2017   Procedure: COLONOSCOPY WITH PROPOFOL;  Surgeon: Lollie Sails, MD;  Location: [facility] ENDOSCOPY;  Service: Endoscopy;  Laterality: N/A;  . colonoscopys     x6  . ESOPHAGOGASTRODUODENOSCOPY    . TONSILLECTOMY      SOCIAL HISTORY: Social History   Socioeconomic History  . Marital status: Married    Spouse name: Not on file  . Number of children: Not on file  . Years of education: Not on file  . Highest education level: Not on file  Occupational History  . Not on file  Social Needs  . Financial resource strain: Not on file  . Food insecurity    Worry: Not on file    Inability: Not on file  . Transportation needs    Medical: Not on file    Non-medical: Not on file  Tobacco Use  . Smoking status: Current Some Day Smoker    Types: Cigars  . Smokeless tobacco: Never Used  Substance and Sexual Activity  . Alcohol use: No  . Drug use: No  . Sexual activity: Not on file  Lifestyle  . Physical activity    Days per week: Not on file    Minutes per session: Not on file  . Stress: Not on file  Relationships  . Social Herbalist on phone: Not on file    Gets together: Not on file    Attends religious service: Not on file    Active member of club or organization: Not on file    Attends meetings of clubs or organizations: Not on file    Relationship status: Not on file  . Intimate partner violence    Fear of current or ex partner: Not on file    Emotionally abused: Not on file    Physically abused: Not on file    Forced sexual activity: Not on file   Other Topics Concern  . Not on file  Social History Narrative  . Not on file    FAMILY HISTORY: Family History  Problem Relation Age of Onset  . Diabetes Mother   . Hypertension Father   . Hypertension Maternal Grandmother   . Diabetes Paternal Grandmother   . Hypertension Paternal Grandmother   . Anemia Paternal Grandfather     ALLERGIES:  is allergic to amoxicillin; levaquin [levofloxacin]; and mercaptopurine.  MEDICATIONS:  Current Outpatient Medications  Medication Sig Dispense Refill  . ACIDOPHILUS LACTOBACILLUS PO Take by mouth.     . Adalimumab (HUMIRA PEN) 40 MG/0.8ML PNKT Inject into the skin.    . budesonide (ENTOCORT EC) 3 MG 24 hr capsule Take 2 mg daily by mouth.     . Multiple Vitamin (MULTIVITAMIN) tablet Take 1 tablet by mouth daily.    . nebivolol (BYSTOLIC) 5 MG tablet Take 5 mg by mouth daily.    . Omega-3 Fatty Acids (OMEGA-3 FISH OIL PO) Take by mouth.    . pantoprazole (PROTONIX) 40 MG tablet  Take 40 mg by mouth daily.     No current facility-administered medications for this visit.       [name]  PHYSICAL EXAMINATION: ECOG PERFORMANCE STATUS: 0 - Asymptomatic Vitals:   10/06/19 1455  BP: 129/85  Pulse: 64  Resp: 18  Temp: (!) 97.4 F (36.3 C)   Filed Weights   10/06/19 1455  Weight: 238 lb (108 kg)   Physical Exam Constitutional:      General: He is not in acute distress. HENT:     Head: Normocephalic and atraumatic.     Mouth/Throat:     Pharynx: No oropharyngeal exudate.  Eyes:     General: No scleral icterus.       Left eye: No discharge.     Pupils: Pupils are equal, round, and reactive to light.  Neck:     Musculoskeletal: Normal range of motion and neck supple.  Cardiovascular:     Rate and Rhythm: Normal rate and regular rhythm.     Heart sounds: Normal heart sounds. No murmur. No friction rub.  Pulmonary:     Effort: Pulmonary effort is normal. No respiratory distress.     Breath sounds: No stridor. No wheezing.   Abdominal:     General: Bowel sounds are normal. There is no distension.     Palpations: Abdomen is soft. There is no mass.     Tenderness: There is no abdominal tenderness.  Musculoskeletal: Normal range of motion.        General: No deformity.  Lymphadenopathy:     Cervical: No cervical adenopathy.  Skin:    General: Skin is warm and dry.     Findings: No erythema or rash.  Neurological:     Mental Status: He is alert and oriented to person, place, and time.     Cranial Nerves: No cranial nerve deficit.     Coordination: Coordination normal.  Psychiatric:        Behavior: Behavior normal.        Thought Content: Thought content normal.    LABORATORY DATA:  I have reviewed the data as listed Lab Results  Component Value Date   WBC 13.1 (H) 10/03/2019   HGB 17.1 (H) 10/03/2019   HCT 48.3 10/03/2019   MCV 91.5 10/03/2019   PLT 254 10/03/2019   Recent Labs    10/03/19 1523  NA 139  K 3.9  CL 108  CO2 24  GLUCOSE 98  BUN 13  CREATININE 0.94  CALCIUM 8.7*  GFRNONAA >60  GFRAA >60  PROT 6.9  ALBUMIN 3.9  AST 19  ALT 31  ALKPHOS 57  BILITOT 0.5   Surgical Pathology 07/02/2017 DIAGNOSIS:  A. ULCERS, ILEUM; COLD BIOPSY:  - SMALL BOWEL MUCOSA WITH FEATURES OF A HEALING MUCOSAL INJURY.  - NEGATIVE FOR DYSPLASIA AND MALIGNANCY.  B. TERMINAL ILEUM; COLD BIOPSY:  - NO PATHOLOGIC CHANGE.  C. ILEOCECAL OPENING; COLD BIOPSY:  - MILD CHRONIC ACTIVE ENTERITIS/COLITIS.  - NEGATIVE FOR DYSPLASIA AND MALIGNANCY.  D. COLON POLYP, CECUM; COLD BIOPSY:  - TUBULAR ADENOMA.  - NEGATIVE FOR HIGH-GRADE DYSPLASIA AND MALIGNANCY.  E. COLON, CECUM; COLD BIOPSY:  -NO PATHOLOGIC CHANGE.  F. ASCENDING COLON; COLD BIOPSY:  - NO PATHOLOGIC CHANGE.  G. TRANSVERSE COLON; COLD BIOPSY:  - NO PATHOLOGIC CHANGE.  H. DESCENDING COLON; COLD BIOPSY:  - NO PATHOLOGIC CHANGE.  I. SIGMOID COLON; COLD BIOPSY:  - SMALL LYMPHOID AGGREGATE AND HEMORRHAGE.  - NEGATIVE FOR COLITIS,  DYSPLASIA AND MALIGNANCY.  J. RECTUM;  COLD BIOPSY:  - NO PATHOLOGIC CHANGE.    Negative for, Bcr-Abl, Jak2 exon 14 with reflex to CARL/MPL/Exon 12.  Normal and erythropoietin level at 8.4 Negative flow cytometry.  ASSESSMENT & PLAN:  1. Secondary polycythemia   2. Tobacco abuse    Polycythemia,secondary secondary to smoking and sleep apnea. Labs are reviewed and discussed with patient. Hemoglobin 17.1 with hematocrit 48.3. Hold phlebotomy given that hematocrit is less than 50. Patient will repeat blood work in 3 months and if hematocrit is above 50 we will proceed with phlebotomy 500 cc. Follow-up in 6 months with blood work and possible phlebotomy. Smoking cessation was with patient. All questions were answered. The patient knows to call the clinic with any problems questions or concerns. Follow-up in 6 months with repeat labs/possible phlebotomy.Earlie Server, MD, PhD Hematology Oncology [facility] at [facility] Pager- [phone] 10/06/2019

## 2020-01-06 ENCOUNTER — Other Ambulatory Visit: Payer: Self-pay

## 2020-01-06 ENCOUNTER — Inpatient Hospital Stay: Payer: 59 | Attending: Oncology

## 2020-01-06 ENCOUNTER — Inpatient Hospital Stay: Payer: 59

## 2020-01-06 VITALS — BP 136/77 | HR 71 | Temp 98.1°F | Resp 18

## 2020-01-06 DIAGNOSIS — D751 Secondary polycythemia: Secondary | ICD-10-CM

## 2020-01-06 DIAGNOSIS — D582 Other hemoglobinopathies: Secondary | ICD-10-CM

## 2020-01-06 DIAGNOSIS — F17210 Nicotine dependence, cigarettes, uncomplicated: Secondary | ICD-10-CM | POA: Diagnosis not present

## 2020-01-06 LAB — HEMOGLOBIN: Hemoglobin: 17.7 g/dL — ABNORMAL HIGH (ref 13.0–17.0)

## 2020-01-06 LAB — HEMATOCRIT: HCT: 51.7 % (ref 39.0–52.0)

## 2020-04-05 ENCOUNTER — Other Ambulatory Visit: Payer: 59

## 2020-04-05 ENCOUNTER — Ambulatory Visit: Payer: 59 | Admitting: Oncology

## 2020-04-13 ENCOUNTER — Inpatient Hospital Stay (HOSPITAL_BASED_OUTPATIENT_CLINIC_OR_DEPARTMENT_OTHER): Payer: 59 | Admitting: Oncology

## 2020-04-13 ENCOUNTER — Encounter: Payer: Self-pay | Admitting: Oncology

## 2020-04-13 ENCOUNTER — Inpatient Hospital Stay: Payer: 59

## 2020-04-13 ENCOUNTER — Inpatient Hospital Stay: Payer: 59 | Attending: Oncology

## 2020-04-13 ENCOUNTER — Other Ambulatory Visit: Payer: Self-pay

## 2020-04-13 VITALS — BP 131/80 | HR 67 | Resp 18

## 2020-04-13 VITALS — BP 133/84 | HR 72 | Temp 97.3°F | Resp 18 | Wt 240.8 lb

## 2020-04-13 DIAGNOSIS — K219 Gastro-esophageal reflux disease without esophagitis: Secondary | ICD-10-CM | POA: Insufficient documentation

## 2020-04-13 DIAGNOSIS — D751 Secondary polycythemia: Secondary | ICD-10-CM | POA: Diagnosis present

## 2020-04-13 DIAGNOSIS — Z79899 Other long term (current) drug therapy: Secondary | ICD-10-CM | POA: Diagnosis not present

## 2020-04-13 DIAGNOSIS — Z833 Family history of diabetes mellitus: Secondary | ICD-10-CM | POA: Diagnosis not present

## 2020-04-13 DIAGNOSIS — I10 Essential (primary) hypertension: Secondary | ICD-10-CM | POA: Diagnosis not present

## 2020-04-13 DIAGNOSIS — Z7952 Long term (current) use of systemic steroids: Secondary | ICD-10-CM | POA: Diagnosis not present

## 2020-04-13 DIAGNOSIS — F17290 Nicotine dependence, other tobacco product, uncomplicated: Secondary | ICD-10-CM | POA: Insufficient documentation

## 2020-04-13 DIAGNOSIS — Z720 Tobacco use: Secondary | ICD-10-CM | POA: Diagnosis not present

## 2020-04-13 DIAGNOSIS — Z8249 Family history of ischemic heart disease and other diseases of the circulatory system: Secondary | ICD-10-CM | POA: Insufficient documentation

## 2020-04-13 DIAGNOSIS — E785 Hyperlipidemia, unspecified: Secondary | ICD-10-CM | POA: Diagnosis not present

## 2020-04-13 DIAGNOSIS — G4730 Sleep apnea, unspecified: Secondary | ICD-10-CM | POA: Insufficient documentation

## 2020-04-13 DIAGNOSIS — K5090 Crohn's disease, unspecified, without complications: Secondary | ICD-10-CM | POA: Diagnosis not present

## 2020-04-13 DIAGNOSIS — D582 Other hemoglobinopathies: Secondary | ICD-10-CM

## 2020-04-13 LAB — CBC WITH DIFFERENTIAL/PLATELET
Abs Immature Granulocytes: 0.04 10*3/uL (ref 0.00–0.07)
Basophils Absolute: 0.1 10*3/uL (ref 0.0–0.1)
Basophils Relative: 1 %
Eosinophils Absolute: 0.3 10*3/uL (ref 0.0–0.5)
Eosinophils Relative: 3 %
HCT: 50.8 % (ref 39.0–52.0)
Hemoglobin: 17.7 g/dL — ABNORMAL HIGH (ref 13.0–17.0)
Immature Granulocytes: 0 %
Lymphocytes Relative: 24 %
Lymphs Abs: 2.8 10*3/uL (ref 0.7–4.0)
MCH: 31.8 pg (ref 26.0–34.0)
MCHC: 34.8 g/dL (ref 30.0–36.0)
MCV: 91.2 fL (ref 80.0–100.0)
Monocytes Absolute: 1.1 10*3/uL — ABNORMAL HIGH (ref 0.1–1.0)
Monocytes Relative: 9 %
Neutro Abs: 7.6 10*3/uL (ref 1.7–7.7)
Neutrophils Relative %: 63 %
Platelets: 273 10*3/uL (ref 150–400)
RBC: 5.57 MIL/uL (ref 4.22–5.81)
RDW: 13 % (ref 11.5–15.5)
WBC: 11.9 10*3/uL — ABNORMAL HIGH (ref 4.0–10.5)
nRBC: 0 % (ref 0.0–0.2)

## 2020-04-13 NOTE — Progress Notes (Signed)
Therapeutic phlebotomy performed per MD order. Removing 500cc using a 20 gauge in rt AC. Pt tolerated procedure well. Snack and drink offered. Pt denies any concerns, no s/s of distress noted. Pt and VS stable at discharge.

## 2020-04-13 NOTE — Progress Notes (Signed)
Hematology/Oncology Follow Up Note [facility] Telephone:([phone] Fax:([phone]   Patient Care Team: [name], MD as PCP - General (Internal Medicine)  REFERRING PROVIDER: Dorian Furnace, Gastroenterology.  REASON FOR VISIT Follow up for treatment of secondary polycythemia.   HISTORY OF PRESENTING ILLNESS:  Todd Shepherd 49 y.o.  male with PMH listed as below who was referred by gastroenterology to me for evaluation of persistent leukocytosis and elevated hemoglobin.  Extensive medical records from both [place] were reviewed and HemOnc related problems and patient's complaints are listed as following  1 Crohn's disease: he has been on Humira 41m [place] weekly and Budesonide 629mPO daily. His Surveillance colonoscopy was  done 07/02/17 and demonstrated an adenoma. Per GI, the TI contained some small ulcers without signs of bleeding and mucosa in between ulcers normal. The IC valve was open and not narrowed. It was noted that the exam was otherwise normal. Sveral biopsies were taken: the ileal ulcers were noted to have hte characteristics of healing mucosal injury. There was mild active chronic colitis at the ileocecal opening. The TI, cecum, ascending, tranverse, descending, sigmoid colon, and rectum did not have any colitis/pathologic change. There was a small lymphoid aggregate in the sigmoid colon. There was no dysplasia/malignancy.  2 Persistent leukocytosis: wbc  was 10.8 on 07/12/2017, 12.8 on 07/23/2017 and 13.9 on 08/28/2017, ANC 8.98. Basophil 0.13,   3 Persistent elevated hemoglobin: Hb was 19.4, Hct 55.5 on 07/12/2017, Hb was 12.8 Hct  52.6 on 07/23/2017, Hb was 18.6 and Hct was 52.8 on 08/28/2017  4 Patient reports that he was seen by physician 10-15 years ago due to elevated blood counts and lab work up was done. He can not remember details of his diagnosis. He did not get any phlebotomy or medication for that condition. He  has itchiness, which is chronic, intermittent, usually triggered after hot shower.  Denies any history of blood clot.     5 patient initially reported that he does not smoke cigarettes. After I got his preliminary lab work which showed high carbomonoxide level and I called him with further questioning he reports that he smokes cigar. He was advised to quit smoking cigars.  His home furnace was recently checked and there was no gas leak.  -Patient has not been started on sleep apnea treatment with CPAP.  Per note, he was advised to use CPAP and he declined.  Interval history [age].o. male presented for follow-up for the management of erythrocytosis/polycythemia.  He continues to smoke cigars.   He reports feeling well.  Denies any constitutional symptoms. Crohn's disease, patient is on Humira subcutaneously and budesonide 3 mg p.o.  He follows up with gastroenterology and was last seen in February 2021.   ROS:  Review of Systems  Constitutional: Negative for appetite change, chills, fatigue, fever and unexpected weight change.  HENT:   Negative for hearing loss, lump/mass and voice change.   Eyes: Negative for eye problems and icterus.  Respiratory: Negative for chest tightness, cough and shortness of breath.   Cardiovascular: Negative for chest pain and leg swelling.  Gastrointestinal: Negative for abdominal distention and abdominal pain.  Endocrine: Negative for hot flashes.  Genitourinary: Negative for difficulty urinating, dysuria and frequency.   Musculoskeletal: Negative for arthralgias, back pain and gait problem.  Skin: Negative for itching and rash.  Neurological: Negative for dizziness, gait problem, light-headedness and numbness.  Hematological: Negative for adenopathy. Does not bruise/bleed easily.  Psychiatric/Behavioral: Negative for confusion. The patient is not nervous/anxious.     MEDICAL HISTORY:  Past Medical History:  Diagnosis Date  . Atypical nevus x 2  11/03/2016   Right Abd Sup-Moderate(punch), and Right Abd Inf-Mild  . Benign hypertension   . Crohn's disease ([place])   . GERD (gastroesophageal reflux disease)   . Hyperlipidemia     SURGICAL HISTORY: Past Surgical History:  Procedure Laterality Date  . COLONOSCOPY WITH PROPOFOL N/A 07/02/2017   Procedure: COLONOSCOPY WITH PROPOFOL;  Surgeon: Lollie Sails, MD;  Location: [facility] ENDOSCOPY;  Service: Endoscopy;  Laterality: N/A;  . colonoscopys     x6  . ESOPHAGOGASTRODUODENOSCOPY    . TONSILLECTOMY      SOCIAL HISTORY: Social History   Socioeconomic History  . Marital status: Divorced    Spouse name: Not on file  . Number of children: Not on file  . Years of education: Not on file  . Highest education level: Not on file  Occupational History  . Not on file  Tobacco Use  . Smoking status: Current Some Day Smoker    Types: Cigars  . Smokeless tobacco: Never Used  Substance and Sexual Activity  . Alcohol use: No  . Drug use: No  . Sexual activity: Not on file  Other Topics Concern  . Not on file  Social History Narrative  . Not on file   Social Determinants of Health   Financial Resource Strain:   . Difficulty of Paying Living Expenses:   Food Insecurity:   . Worried About Charity fundraiser in the Last Year:   . Arboriculturist in the Last Year:   Transportation Needs:   . Film/video editor (Medical):   [name] Lack of Transportation (Non-Medical):   Physical Activity:   . Days of Exercise per Week:   . Minutes of Exercise per Session:   Stress:   . Feeling of Stress :   Social Connections:   . Frequency of Communication with Friends and Family:   . Frequency of Social Gatherings with Friends and Family:   . Attends Religious Services:   . Active Member of Clubs or Organizations:   . Attends Archivist Meetings:   [name] Marital Status:   Intimate Partner Violence:   . Fear of Current or Ex-Partner:   . Emotionally Abused:   [name] Physically Abused:    . Sexually Abused:     FAMILY HISTORY: Family History  Problem Relation Age of Onset  . Diabetes Mother   . Hypertension Father   . Hypertension Maternal Grandmother   . Diabetes Paternal Grandmother   . Hypertension Paternal Grandmother   . Anemia Paternal Grandfather     ALLERGIES:  is allergic to amoxicillin; levaquin [levofloxacin]; and mercaptopurine.  MEDICATIONS:  Current Outpatient Medications  Medication Sig Dispense Refill  . ACIDOPHILUS LACTOBACILLUS PO Take by mouth.     . Adalimumab (HUMIRA PEN) 40 MG/0.8ML PNKT Inject into the skin.    . bisoprolol (ZEBETA) 5 MG tablet Take 5 mg by mouth daily.    . budesonide (ENTOCORT EC) 3 MG 24 hr capsule Take 2 mg daily by mouth.     . Multiple Vitamin (MULTIVITAMIN) tablet Take 1 tablet by mouth daily.    . Omega-3 Fatty Acids (OMEGA-3 FISH OIL PO) Take by mouth.    . pantoprazole (PROTONIX) 40 MG tablet Take 40 mg by mouth daily.    . rosuvastatin (CRESTOR) 10 MG tablet Take by mouth.    [name]  nebivolol (BYSTOLIC) 5 MG tablet Take 5 mg by mouth daily.     No current facility-administered medications for this visit.      [name]  PHYSICAL EXAMINATION: ECOG PERFORMANCE STATUS: 0 - Asymptomatic Vitals:   04/13/20 1320  BP: 133/84  Pulse: 72  Resp: 18  Temp: (!) 97.3 F (36.3 C)   Filed Weights   04/13/20 1320  Weight: 240 lb 12.8 oz (109.2 kg)   Physical Exam Constitutional:      General: He is not in acute distress. HENT:     Head: Normocephalic and atraumatic.     Mouth/Throat:     Pharynx: No oropharyngeal exudate.  Eyes:     General: No scleral icterus.       Left eye: No discharge.     Pupils: Pupils are equal, round, and reactive to light.  Cardiovascular:     Rate and Rhythm: Normal rate and regular rhythm.     Heart sounds: Normal heart sounds. No murmur. No friction rub.  Pulmonary:     Effort: Pulmonary effort is normal. No respiratory distress.     Breath sounds: No stridor. No wheezing.    Abdominal:     General: Bowel sounds are normal. There is no distension.     Palpations: Abdomen is soft. There is no mass.     Tenderness: There is no abdominal tenderness.  Musculoskeletal:        General: No deformity. Normal range of motion.     Cervical back: Normal range of motion and neck supple.  Lymphadenopathy:     Cervical: No cervical adenopathy.  Skin:    General: Skin is warm and dry.     Findings: No erythema or rash.  Neurological:     Mental Status: He is alert and oriented to person, place, and time. Mental status is at baseline.     Cranial Nerves: No cranial nerve deficit.     Coordination: Coordination normal.  Psychiatric:        Mood and Affect: Mood normal.    LABORATORY DATA:  I have reviewed the data as listed Lab Results  Component Value Date   WBC 11.9 (H) 04/13/2020   HGB 17.7 (H) 04/13/2020   HCT 50.8 04/13/2020   MCV 91.2 04/13/2020   PLT 273 04/13/2020   Recent Labs    10/03/19 1523  NA 139  K 3.9  CL 108  CO2 24  GLUCOSE 98  BUN 13  CREATININE 0.94  CALCIUM 8.7*  GFRNONAA >60  GFRAA >60  PROT 6.9  ALBUMIN 3.9  AST 19  ALT 31  ALKPHOS 57  BILITOT 0.5   Surgical Pathology 07/02/2017 DIAGNOSIS:  A. ULCERS, ILEUM; COLD BIOPSY:  - SMALL BOWEL MUCOSA WITH FEATURES OF A HEALING MUCOSAL INJURY.  - NEGATIVE FOR DYSPLASIA AND MALIGNANCY.  B. TERMINAL ILEUM; COLD BIOPSY:  - NO PATHOLOGIC CHANGE.  C. ILEOCECAL OPENING; COLD BIOPSY:  - MILD CHRONIC ACTIVE ENTERITIS/COLITIS.  - NEGATIVE FOR DYSPLASIA AND MALIGNANCY.  D. COLON POLYP, CECUM; COLD BIOPSY:  - TUBULAR ADENOMA.  - NEGATIVE FOR HIGH-GRADE DYSPLASIA AND MALIGNANCY.  E. COLON, CECUM; COLD BIOPSY:  -NO PATHOLOGIC CHANGE.  F. ASCENDING COLON; COLD BIOPSY:  - NO PATHOLOGIC CHANGE.  G. TRANSVERSE COLON; COLD BIOPSY:  - NO PATHOLOGIC CHANGE.  H. DESCENDING COLON; COLD BIOPSY:  - NO PATHOLOGIC CHANGE.  I. SIGMOID COLON; COLD BIOPSY:  - SMALL LYMPHOID AGGREGATE AND  HEMORRHAGE.  - NEGATIVE FOR COLITIS, DYSPLASIA AND MALIGNANCY.  J.  RECTUM; COLD BIOPSY:  - NO PATHOLOGIC CHANGE.    Negative for, Bcr-Abl, Jak2 exon 14 with reflex to CARL/MPL/Exon 12.  Normal and erythropoietin level at 8.4 Negative flow cytometry.  ASSESSMENT & PLAN:  1. Secondary polycythemia   2. Tobacco abuse    polycythemia,secondary secondary to smoking and sleep apnea. Labs reviewed and discussed with patient .  Hemoglobin 17.7 with hematocrit 50.8. Recommend to proceed with phlebotomy to keep hematocrit less than 50 . Patient will repeat blood work in 3 months and if hematocrit is above 50, will proceed with phlebotomy. Smoke cessation was discussed with patient again.  Encouraged him to continue his effort in quitting smoking. All questions were answered. The patient knows to call the clinic with any problems questions or concerns. Follow-up in 6 months with repeat labs/possible phlebotomy.Earlie Server, MD, PhD Hematology Oncology [facility] at [facility] Pager- [phone] 04/13/2020

## 2020-04-13 NOTE — Progress Notes (Signed)
Patient does not offer any problems today.  

## 2020-04-22 ENCOUNTER — Other Ambulatory Visit: Payer: Self-pay

## 2020-04-22 ENCOUNTER — Other Ambulatory Visit: Payer: Self-pay | Admitting: Family Medicine

## 2020-04-22 ENCOUNTER — Ambulatory Visit
Admission: RE | Admit: 2020-04-22 | Discharge: 2020-04-22 | Disposition: A | Discharge status: Home / Self Care | Payer: 59 | Source: Ambulatory Visit | Attending: Family Medicine | Admitting: Family Medicine

## 2020-04-22 DIAGNOSIS — M79661 Pain in right lower leg: Secondary | ICD-10-CM | POA: Diagnosis not present

## 2020-04-22 DIAGNOSIS — I8391 Asymptomatic varicose veins of right lower extremity: Secondary | ICD-10-CM

## 2020-07-15 ENCOUNTER — Other Ambulatory Visit: Payer: Self-pay

## 2020-07-15 DIAGNOSIS — D751 Secondary polycythemia: Secondary | ICD-10-CM

## 2020-07-16 ENCOUNTER — Inpatient Hospital Stay: Payer: 59 | Attending: Oncology

## 2020-07-16 ENCOUNTER — Inpatient Hospital Stay: Payer: 59

## 2020-10-12 ENCOUNTER — Other Ambulatory Visit: Payer: Self-pay

## 2020-10-12 DIAGNOSIS — D751 Secondary polycythemia: Secondary | ICD-10-CM

## 2020-10-13 ENCOUNTER — Inpatient Hospital Stay: Payer: 59 | Attending: Oncology

## 2020-10-13 ENCOUNTER — Inpatient Hospital Stay (HOSPITAL_BASED_OUTPATIENT_CLINIC_OR_DEPARTMENT_OTHER): Payer: 59 | Admitting: Oncology

## 2020-10-13 ENCOUNTER — Inpatient Hospital Stay: Payer: 59

## 2020-10-13 ENCOUNTER — Encounter: Payer: Self-pay | Admitting: Oncology

## 2020-10-13 ENCOUNTER — Other Ambulatory Visit: Payer: Self-pay

## 2020-10-13 VITALS — BP 139/90 | HR 68 | Temp 98.9°F | Resp 18 | Wt 251.9 lb

## 2020-10-13 DIAGNOSIS — Z7952 Long term (current) use of systemic steroids: Secondary | ICD-10-CM | POA: Insufficient documentation

## 2020-10-13 DIAGNOSIS — I10 Essential (primary) hypertension: Secondary | ICD-10-CM | POA: Diagnosis not present

## 2020-10-13 DIAGNOSIS — F17290 Nicotine dependence, other tobacco product, uncomplicated: Secondary | ICD-10-CM | POA: Insufficient documentation

## 2020-10-13 DIAGNOSIS — E785 Hyperlipidemia, unspecified: Secondary | ICD-10-CM | POA: Insufficient documentation

## 2020-10-13 DIAGNOSIS — K219 Gastro-esophageal reflux disease without esophagitis: Secondary | ICD-10-CM | POA: Diagnosis not present

## 2020-10-13 DIAGNOSIS — Z791 Long term (current) use of non-steroidal anti-inflammatories (NSAID): Secondary | ICD-10-CM | POA: Insufficient documentation

## 2020-10-13 DIAGNOSIS — Z79899 Other long term (current) drug therapy: Secondary | ICD-10-CM | POA: Insufficient documentation

## 2020-10-13 DIAGNOSIS — Z833 Family history of diabetes mellitus: Secondary | ICD-10-CM | POA: Diagnosis not present

## 2020-10-13 DIAGNOSIS — D751 Secondary polycythemia: Secondary | ICD-10-CM

## 2020-10-13 DIAGNOSIS — K5090 Crohn's disease, unspecified, without complications: Secondary | ICD-10-CM | POA: Insufficient documentation

## 2020-10-13 DIAGNOSIS — G4730 Sleep apnea, unspecified: Secondary | ICD-10-CM | POA: Diagnosis not present

## 2020-10-13 DIAGNOSIS — Z8249 Family history of ischemic heart disease and other diseases of the circulatory system: Secondary | ICD-10-CM | POA: Diagnosis not present

## 2020-10-13 LAB — CBC WITH DIFFERENTIAL/PLATELET
Abs Immature Granulocytes: 0.04 10*3/uL (ref 0.00–0.07)
Basophils Absolute: 0.1 10*3/uL (ref 0.0–0.1)
Basophils Relative: 1 %
Eosinophils Absolute: 0.2 10*3/uL (ref 0.0–0.5)
Eosinophils Relative: 1 %
HCT: 47.7 % (ref 39.0–52.0)
Hemoglobin: 17 g/dL (ref 13.0–17.0)
Immature Granulocytes: 0 %
Lymphocytes Relative: 28 %
Lymphs Abs: 3.3 10*3/uL (ref 0.7–4.0)
MCH: 32.4 pg (ref 26.0–34.0)
MCHC: 35.6 g/dL (ref 30.0–36.0)
MCV: 90.9 fL (ref 80.0–100.0)
Monocytes Absolute: 0.8 10*3/uL (ref 0.1–1.0)
Monocytes Relative: 7 %
Neutro Abs: 7.5 10*3/uL (ref 1.7–7.7)
Neutrophils Relative %: 63 %
Platelets: 268 10*3/uL (ref 150–400)
RBC: 5.25 MIL/uL (ref 4.22–5.81)
RDW: 13.2 % (ref 11.5–15.5)
WBC: 12 10*3/uL — ABNORMAL HIGH (ref 4.0–10.5)
nRBC: 0 % (ref 0.0–0.2)

## 2020-10-13 NOTE — Progress Notes (Signed)
Hematology/Oncology Follow Up Note [facility] [phone] [phone]   Patient Care Team: [name], MD as PCP - General (Internal Medicine) Earlie Server, MD as Consulting Physician (Oncology)  REFERRING PROVIDER: Dorian Furnace, Gastroenterology.  REASON FOR VISIT Follow up for treatment of secondary polycythemia.   HISTORY OF PRESENTING ILLNESS:  Todd Shepherd 49 y.o.  male with PMH listed as below who was referred by gastroenterology to me for evaluation of persistent leukocytosis and elevated hemoglobin.  Extensive medical records from both [place] were reviewed and HemOnc related problems and patient's complaints are listed as following  1 Crohn's disease: he has been on Humira 49m [place] weekly and Budesonide 657mPO daily. His Surveillance colonoscopy was  done 07/02/17 and demonstrated an adenoma. Per GI, the TI contained some small ulcers without signs of bleeding and mucosa in between ulcers normal. The IC valve was open and not narrowed. It was noted that the exam was otherwise normal. Sveral biopsies were taken: the ileal ulcers were noted to have hte characteristics of healing mucosal injury. There was mild active chronic colitis at the ileocecal opening. The TI, cecum, ascending, tranverse, descending, sigmoid colon, and rectum did not have any colitis/pathologic change. There was a small lymphoid aggregate in the sigmoid colon. There was no dysplasia/malignancy.  2 Persistent leukocytosis: wbc  was 10.8 on 07/12/2017, 12.8 on 07/23/2017 and 13.9 on 08/28/2017, ANC 8.98. Basophil 0.13,   3 Persistent elevated hemoglobin: Hb was 19.4, Hct 55.5 on 07/12/2017, Hb was 12.8 Hct  52.6 on 07/23/2017, Hb was 18.6 and Hct was 52.8 on 08/28/2017  4 Patient reports that he was seen by physician 10-15 years ago due to elevated blood counts and lab work up was done. He can not remember details of his diagnosis. He did not get  any phlebotomy or medication for that condition. He has itchiness, which is chronic, intermittent, usually triggered after hot shower.  Denies any history of blood clot.     5 patient initially reported that he does not smoke cigarettes. After I got his preliminary lab work which showed high carbomonoxide level and I called him with further questioning he reports that he smokes cigar. He was advised to quit smoking cigars.  His home furnace was recently checked and there was no gas leak.  -Patient has not been started on sleep apnea treatment with CPAP.  Per note, he was advised to use CPAP and he declined.  # Crohn's disease, patient is on Humira subcutaneously and budesonide 3 mg p.o.  He follows up with gastroenterology  Interval history [age].o. male presented for follow-up for the management of erythrocytosis/polycythemia. He reports feeling well. Family member passed away from [id number] He has smoked less    ROS:  Review of Systems  Constitutional: Negative for appetite change, chills, fatigue, fever and unexpected weight change.  HENT:   Negative for hearing loss, lump/mass and voice change.   Eyes: Negative for eye problems and icterus.  Respiratory: Negative for chest tightness, cough and shortness of breath.   Cardiovascular: Negative for chest pain and leg swelling.  Gastrointestinal: Negative for abdominal distention and abdominal pain.  Endocrine: Negative for hot flashes.  Genitourinary: Negative for difficulty urinating, dysuria and frequency.   Musculoskeletal: Negative for arthralgias, back pain and gait problem.  Skin: Negative for itching and rash.  Neurological: Negative for dizziness, gait problem, light-headedness and numbness.  Hematological: Negative for adenopathy. Does not  bruise/bleed easily.  Psychiatric/Behavioral: Negative for confusion. The patient is not nervous/anxious.     MEDICAL HISTORY:  Past Medical History:  Diagnosis Date   Atypical nevus x 2  11/03/2016   Right Abd Sup-Moderate(punch), and Right Abd Inf-Mild   Benign hypertension    Crohn's disease ([place])    GERD (gastroesophageal reflux disease)    Hyperlipidemia     SURGICAL HISTORY: Past Surgical History:  Procedure Laterality Date   COLONOSCOPY WITH PROPOFOL N/A 07/02/2017   Procedure: COLONOSCOPY WITH PROPOFOL;  Surgeon: Lollie Sails, MD;  Location: [facility] ENDOSCOPY;  Service: Endoscopy;  Laterality: N/A;   colonoscopys     x6   ESOPHAGOGASTRODUODENOSCOPY     TONSILLECTOMY      SOCIAL HISTORY: Social History   Socioeconomic History   Marital status: Divorced    Spouse name: Not on file   Number of children: Not on file   Years of education: Not on file   Highest education level: Not on file  Occupational History   Not on file  Tobacco Use   Smoking status: Current Some Day Smoker    Types: Cigars   Smokeless tobacco: Never Used  Vaping Use   Vaping Use: Never used  Substance and Sexual Activity   Alcohol use: No   Drug use: No   Sexual activity: Not on file  Other Topics Concern   Not on file  Social History Narrative   Not on file   Social Determinants of Health   Financial Resource Strain:    Difficulty of Paying Living Expenses: Not on file  Food Insecurity:    Worried About Bigfoot in the Last Year: Not on file   Ran Out of Food in the Last Year: Not on file  Transportation Needs:    Lack of Transportation (Medical): Not on file   Lack of Transportation (Non-Medical): Not on file  Physical Activity:    Days of Exercise per Week: Not on file   Minutes of Exercise per Session: Not on file  Stress:    Feeling of Stress : Not on file  Social Connections:    Frequency of Communication with Friends and Family: Not on file   Frequency of Social Gatherings with Friends and Family: Not on file   Attends Religious Services: Not on file   Active Member of Clubs or Organizations: Not on file    Attends Archivist Meetings: Not on file   Marital Status: Not on file  Intimate Partner Violence:    Fear of Current or Ex-Partner: Not on file   Emotionally Abused: Not on file   Physically Abused: Not on file   Sexually Abused: Not on file    FAMILY HISTORY: Family History  Problem Relation Age of Onset   Diabetes Mother    Hypertension Father    Hypertension Maternal Grandmother    Diabetes Paternal Grandmother    Hypertension Paternal Grandmother    Anemia Paternal Grandfather     ALLERGIES:  is allergic to amoxicillin, levaquin [levofloxacin], and mercaptopurine.  MEDICATIONS:  Current Outpatient Medications  Medication Sig Dispense Refill   ACIDOPHILUS LACTOBACILLUS PO Take by mouth.      Adalimumab (HUMIRA PEN) 40 MG/0.8ML PNKT Inject into the skin once a week.      azithromycin (ZITHROMAX) 250 MG tablet 2 tabs on day one, then 1 tab daily x 4 days     bisoprolol (ZEBETA) 5 MG tablet Take 5 mg by mouth daily.  budesonide (ENTOCORT EC) 3 MG 24 hr capsule Take 2 mg daily by mouth.      fluticasone (FLONASE) 50 MCG/ACT nasal spray Place into both nostrils.     meloxicam (MOBIC) 15 MG tablet Take 15 mg by mouth daily.     Multiple Vitamin (MULTIVITAMIN) tablet Take 1 tablet by mouth daily.     Omega-3 Fatty Acids (OMEGA-3 FISH OIL PO) Take by mouth.     pantoprazole (PROTONIX) 40 MG tablet Take 40 mg by mouth daily.     promethazine-dextromethorphan (PROMETHAZINE-DM) 6.25-15 MG/5ML syrup Take 5 mLs by mouth every 6 (six) hours as needed.     rosuvastatin (CRESTOR) 10 MG tablet Take by mouth.     No current facility-administered medications for this visit.      [name]  PHYSICAL EXAMINATION: ECOG PERFORMANCE STATUS: 0 - Asymptomatic Vitals:   10/13/20 1327  BP: 139/90  Pulse: 68  Resp: 18  Temp: 98.9 F (37.2 C)   Filed Weights   10/13/20 1327  Weight: 251 lb 14.4 oz (114.3 kg)   Physical Exam Constitutional:      General:  He is not in acute distress. HENT:     Head: Normocephalic and atraumatic.     Mouth/Throat:     Pharynx: No oropharyngeal exudate.  Eyes:     General: No scleral icterus.       Left eye: No discharge.     Pupils: Pupils are equal, round, and reactive to light.  Cardiovascular:     Rate and Rhythm: Normal rate and regular rhythm.     Heart sounds: Normal heart sounds. No murmur heard.  No friction rub.  Pulmonary:     Effort: Pulmonary effort is normal. No respiratory distress.     Breath sounds: No stridor. No wheezing.  Abdominal:     General: Bowel sounds are normal. There is no distension.     Palpations: Abdomen is soft. There is no mass.     Tenderness: There is no abdominal tenderness.  Musculoskeletal:        General: No deformity. Normal range of motion.     Cervical back: Normal range of motion and neck supple.  Lymphadenopathy:     Cervical: No cervical adenopathy.  Skin:    General: Skin is warm and dry.     Findings: No erythema or rash.  Neurological:     Mental Status: He is alert and oriented to person, place, and time. Mental status is at baseline.     Cranial Nerves: No cranial nerve deficit.     Coordination: Coordination normal.  Psychiatric:        Mood and Affect: Mood normal.    LABORATORY DATA:  I have reviewed the data as listed Lab Results  Component Value Date   WBC 12.0 (H) 10/13/2020   HGB 17.0 10/13/2020   HCT 47.7 10/13/2020   MCV 90.9 10/13/2020   PLT 268 10/13/2020   No results for input(s): NA, K, CL, CO2, GLUCOSE, BUN, CREATININE, CALCIUM, GFRNONAA, GFRAA, PROT, ALBUMIN, AST, ALT, ALKPHOS, BILITOT, BILIDIR, IBILI in the last 8760 hours. Surgical Pathology 07/02/2017 DIAGNOSIS:  A. ULCERS, ILEUM; COLD BIOPSY:  - SMALL BOWEL MUCOSA WITH FEATURES OF A HEALING MUCOSAL INJURY.  - NEGATIVE FOR DYSPLASIA AND MALIGNANCY.  B. TERMINAL ILEUM; COLD BIOPSY:  - NO PATHOLOGIC CHANGE.  C. ILEOCECAL OPENING; COLD BIOPSY:  - MILD CHRONIC  ACTIVE ENTERITIS/COLITIS.  - NEGATIVE FOR DYSPLASIA AND MALIGNANCY.  D. COLON POLYP, CECUM; COLD BIOPSY:  -  TUBULAR ADENOMA.  - NEGATIVE FOR HIGH-GRADE DYSPLASIA AND MALIGNANCY.  E. COLON, CECUM; COLD BIOPSY:  -NO PATHOLOGIC CHANGE.  F. ASCENDING COLON; COLD BIOPSY:  - NO PATHOLOGIC CHANGE.  G. TRANSVERSE COLON; COLD BIOPSY:  - NO PATHOLOGIC CHANGE.  H. DESCENDING COLON; COLD BIOPSY:  - NO PATHOLOGIC CHANGE.  I. SIGMOID COLON; COLD BIOPSY:  - SMALL LYMPHOID AGGREGATE AND HEMORRHAGE.  - NEGATIVE FOR COLITIS, DYSPLASIA AND MALIGNANCY.  J. RECTUM; COLD BIOPSY:  - NO PATHOLOGIC CHANGE.    Negative for, Bcr-Abl, Jak2 exon 14 with reflex to CARL/MPL/Exon 12.  Normal and erythropoietin level at 8.4 Negative flow cytometry.  ASSESSMENT & PLAN:  1. Erythrocytosis    polycythemia,secondary secondary to smoking and sleep apnea. Labs are reviewed and discussed with patient. Hematocrit is less than 50, no need for phlebotomy today. Repeat injection in 3 months and if hematocrit is above 30, proceed with phlebotomy  I encouraged her efforts in smoke cessation. Patient will follow up in 6 months with repeat lab and possible phlebotomy . Marland KitchenFollow-up in 6 months with repeat labs/possible phlebotomy.Earlie Server, MD, PhD Hematology Oncology [facility] at [facility] Pager- [phone] 10/13/2020

## 2020-10-13 NOTE — Progress Notes (Signed)
Pt here for follow up. Currently on antibiotic for sinus infection.

## 2021-01-14 ENCOUNTER — Inpatient Hospital Stay: Payer: 59

## 2021-01-14 ENCOUNTER — Inpatient Hospital Stay: Payer: 59 | Attending: Oncology

## 2021-04-13 ENCOUNTER — Inpatient Hospital Stay: Payer: 59 | Attending: Oncology

## 2021-04-13 ENCOUNTER — Encounter: Payer: Self-pay | Admitting: Oncology

## 2021-04-13 ENCOUNTER — Inpatient Hospital Stay: Payer: 59

## 2021-04-13 ENCOUNTER — Inpatient Hospital Stay: Payer: 59 | Admitting: Oncology

## 2021-04-13 VITALS — BP 138/87 | HR 71 | Temp 98.6°F | Resp 18 | Wt 253.7 lb

## 2021-04-13 DIAGNOSIS — K5090 Crohn's disease, unspecified, without complications: Secondary | ICD-10-CM | POA: Insufficient documentation

## 2021-04-13 DIAGNOSIS — Z833 Family history of diabetes mellitus: Secondary | ICD-10-CM | POA: Insufficient documentation

## 2021-04-13 DIAGNOSIS — F17210 Nicotine dependence, cigarettes, uncomplicated: Secondary | ICD-10-CM | POA: Insufficient documentation

## 2021-04-13 DIAGNOSIS — D72829 Elevated white blood cell count, unspecified: Secondary | ICD-10-CM | POA: Insufficient documentation

## 2021-04-13 DIAGNOSIS — Z8249 Family history of ischemic heart disease and other diseases of the circulatory system: Secondary | ICD-10-CM | POA: Diagnosis not present

## 2021-04-13 DIAGNOSIS — D582 Other hemoglobinopathies: Secondary | ICD-10-CM

## 2021-04-13 DIAGNOSIS — F17290 Nicotine dependence, other tobacco product, uncomplicated: Secondary | ICD-10-CM | POA: Diagnosis not present

## 2021-04-13 DIAGNOSIS — D751 Secondary polycythemia: Secondary | ICD-10-CM

## 2021-04-13 DIAGNOSIS — Z881 Allergy status to other antibiotic agents status: Secondary | ICD-10-CM | POA: Insufficient documentation

## 2021-04-13 DIAGNOSIS — Z79899 Other long term (current) drug therapy: Secondary | ICD-10-CM | POA: Insufficient documentation

## 2021-04-13 DIAGNOSIS — Z1211 Encounter for screening for malignant neoplasm of colon: Secondary | ICD-10-CM | POA: Diagnosis not present

## 2021-04-13 DIAGNOSIS — Z720 Tobacco use: Secondary | ICD-10-CM | POA: Diagnosis not present

## 2021-04-13 DIAGNOSIS — Z880 Allergy status to penicillin: Secondary | ICD-10-CM | POA: Diagnosis not present

## 2021-04-13 DIAGNOSIS — Z832 Family history of diseases of the blood and blood-forming organs and certain disorders involving the immune mechanism: Secondary | ICD-10-CM | POA: Insufficient documentation

## 2021-04-13 LAB — CBC WITH DIFFERENTIAL/PLATELET
Abs Immature Granulocytes: 0.03 10*3/uL (ref 0.00–0.07)
Basophils Absolute: 0.1 10*3/uL (ref 0.0–0.1)
Basophils Relative: 1 %
Eosinophils Absolute: 0.2 10*3/uL (ref 0.0–0.5)
Eosinophils Relative: 2 %
HCT: 51.8 % (ref 39.0–52.0)
Hemoglobin: 17.8 g/dL — ABNORMAL HIGH (ref 13.0–17.0)
Immature Granulocytes: 0 %
Lymphocytes Relative: 25 %
Lymphs Abs: 3 10*3/uL (ref 0.7–4.0)
MCH: 32.2 pg (ref 26.0–34.0)
MCHC: 34.4 g/dL (ref 30.0–36.0)
MCV: 93.8 fL (ref 80.0–100.0)
Monocytes Absolute: 1 10*3/uL (ref 0.1–1.0)
Monocytes Relative: 9 %
Neutro Abs: 7.7 10*3/uL (ref 1.7–7.7)
Neutrophils Relative %: 63 %
Platelets: 251 10*3/uL (ref 150–400)
RBC: 5.52 MIL/uL (ref 4.22–5.81)
RDW: 13.7 % (ref 11.5–15.5)
WBC: 12.2 10*3/uL — ABNORMAL HIGH (ref 4.0–10.5)
nRBC: 0 % (ref 0.0–0.2)

## 2021-04-13 NOTE — Progress Notes (Signed)
Patient tolatered phlebotomy well today.  500 cc removed as ordered. HCT 51.8 today. No concerns voiced. Patient discharged, stable.

## 2021-04-13 NOTE — Progress Notes (Signed)
Patient denies new problems/concerns today.   °

## 2021-04-13 NOTE — Progress Notes (Signed)
Hematology/Oncology Follow Up Note [facility] [phone] [phone]   Patient Care Team: [name], MD as PCP - General (Internal Medicine) Earlie Server, MD as Consulting Physician (Oncology)  REFERRING PROVIDER: Dorian Furnace, Gastroenterology.  REASON FOR VISIT Follow up for treatment of secondary polycythemia.   HISTORY OF PRESENTING ILLNESS:  Todd Shepherd 50 y.o.  male with PMH listed as below who was referred by gastroenterology to me for evaluation of persistent leukocytosis and elevated hemoglobin.  Extensive medical records from both [place] were reviewed and HemOnc related problems and patient's complaints are listed as following  1 Crohn's disease: he has been on Humira 70m [place] weekly and Budesonide 630mPO daily. His Surveillance colonoscopy was  done 07/02/17 and demonstrated an adenoma. Per GI, the TI contained some small ulcers without signs of bleeding and mucosa in between ulcers normal. The IC valve was open and not narrowed. It was noted that the exam was otherwise normal. Sveral biopsies were taken: the ileal ulcers were noted to have hte characteristics of healing mucosal injury. There was mild active chronic colitis at the ileocecal opening. The TI, cecum, ascending, tranverse, descending, sigmoid colon, and rectum did not have any colitis/pathologic change. There was a small lymphoid aggregate in the sigmoid colon. There was no dysplasia/malignancy.  2 Persistent leukocytosis: wbc  was 10.8 on 07/12/2017, 12.8 on 07/23/2017 and 13.9 on 08/28/2017, ANC 8.98. Basophil 0.13,   3 Persistent elevated hemoglobin: Hb was 19.4, Hct 55.5 on 07/12/2017, Hb was 12.8 Hct  52.6 on 07/23/2017, Hb was 18.6 and Hct was 52.8 on 08/28/2017  4 Patient reports that he was seen by physician 10-15 years ago due to elevated blood counts and lab work up was done. He can not remember details of his diagnosis. He did not get  any phlebotomy or medication for that condition. He has itchiness, which is chronic, intermittent, usually triggered after hot shower.  Denies any history of blood clot.     5 patient initially reported that he does not smoke cigarettes. After I got his preliminary lab work which showed high carbomonoxide level and I called him with further questioning he reports that he smokes cigar. He was advised to quit smoking cigars.  His home furnace was recently checked and there was no gas leak.  -Patient has not been started on sleep apnea treatment with CPAP.  Per note, he was advised to use CPAP and he declined.  # Crohn's disease, patient is on Humira subcutaneously and budesonide 3 mg p.o.  He follows up with gastroenterology  Interval history [age].o. male presented for follow-up for the management of erythrocytosis/polycythemia. Patient has increased smoking recently due to going through stress of losing family member. Smokes 4 to 5 cigarettes daily.  Otherwise no new complaints.   ROS:  Review of Systems  Constitutional: Negative for appetite change, chills, fatigue, fever and unexpected weight change.  HENT:   Negative for hearing loss, lump/mass and voice change.   Eyes: Negative for eye problems and icterus.  Respiratory: Negative for chest tightness, cough and shortness of breath.   Cardiovascular: Negative for chest pain and leg swelling.  Gastrointestinal: Negative for abdominal distention and abdominal pain.  Endocrine: Negative for hot flashes.  Genitourinary: Negative for difficulty urinating, dysuria and frequency.   Musculoskeletal: Negative for arthralgias, back pain and gait problem.  Skin: Negative for itching and rash.  Neurological: Negative for dizziness, gait problem, light-headedness and  numbness.  Hematological: Negative for adenopathy. Does not bruise/bleed easily.  Psychiatric/Behavioral: Negative for confusion. The patient is not nervous/anxious.     MEDICAL  HISTORY:  Past Medical History:  Diagnosis Date  . Atypical nevus x 2 11/03/2016   Right Abd Sup-Moderate(punch), and Right Abd Inf-Mild  . Benign hypertension   . Crohn's disease ([place])   . GERD (gastroesophageal reflux disease)   . Hyperlipidemia     SURGICAL HISTORY: Past Surgical History:  Procedure Laterality Date  . COLONOSCOPY WITH PROPOFOL N/A 07/02/2017   Procedure: COLONOSCOPY WITH PROPOFOL;  Surgeon: Lollie Sails, MD;  Location: [facility] ENDOSCOPY;  Service: Endoscopy;  Laterality: N/A;  . colonoscopys     x6  . ESOPHAGOGASTRODUODENOSCOPY    . TONSILLECTOMY      SOCIAL HISTORY: Social History   Socioeconomic History  . Marital status: Divorced    Spouse name: Not on file  . Number of children: Not on file  . Years of education: Not on file  . Highest education level: Not on file  Occupational History  . Not on file  Tobacco Use  . Smoking status: Current Some Day Smoker    Types: Cigars  . Smokeless tobacco: Never Used  Vaping Use  . Vaping Use: Never used  Substance and Sexual Activity  . Alcohol use: No  . Drug use: No  . Sexual activity: Not on file  Other Topics Concern  . Not on file  Social History Narrative  . Not on file   Social Determinants of Health   Financial Resource Strain: Not on file  Food Insecurity: Not on file  Transportation Needs: Not on file  Physical Activity: Not on file  Stress: Not on file  Social Connections: Not on file  Intimate Partner Violence: Not on file    FAMILY HISTORY: Family History  Problem Relation Age of Onset  . Diabetes Mother   . Hypertension Father   . Hypertension Maternal Grandmother   . Diabetes Paternal Grandmother   . Hypertension Paternal Grandmother   . Anemia Paternal Grandfather     ALLERGIES:  is allergic to amoxicillin, levaquin [levofloxacin], and mercaptopurine.  MEDICATIONS:  Current Outpatient Medications  Medication Sig Dispense Refill  . ACIDOPHILUS LACTOBACILLUS PO  Take by mouth.     . Adalimumab 40 MG/0.8ML PNKT Inject into the skin once a week.     . Ascorbic Acid (VITAMIN C) 1000 MG tablet Take 1,000 mg by mouth daily.    . bisoprolol (ZEBETA) 5 MG tablet Take 5 mg by mouth daily.    . budesonide (ENTOCORT EC) 3 MG 24 hr capsule Take 2 mg daily by mouth.     . meloxicam (MOBIC) 15 MG tablet Take 15 mg by mouth daily.    . Multiple Vitamin (MULTIVITAMIN) tablet Take 1 tablet by mouth daily.    . Omega-3 Fatty Acids (OMEGA-3 FISH OIL PO) Take by mouth.    . pantoprazole (PROTONIX) 40 MG tablet Take 40 mg by mouth daily.    . rosuvastatin (CRESTOR) 10 MG tablet Take by mouth.    . zinc gluconate 50 MG tablet Take 50 mg by mouth daily.    . fluticasone (FLONASE) 50 MCG/ACT nasal spray Place into both nostrils. (Patient not taking: Reported on 04/13/2021)    . promethazine-dextromethorphan (PROMETHAZINE-DM) 6.25-15 MG/5ML syrup Take 5 mLs by mouth every 6 (six) hours as needed. (Patient not taking: Reported on 04/13/2021)     No current facility-administered medications for this visit.      [name]  PHYSICAL EXAMINATION: ECOG PERFORMANCE STATUS: 0 - Asymptomatic Vitals:   04/13/21 1309  BP: 138/87  Pulse: 71  Resp: 18  Temp: 98.6 F (37 C)   Filed Weights   04/13/21 1309  Weight: 253 lb 11.2 oz (115.1 kg)   Physical Exam Constitutional:      General: He is not in acute distress. HENT:     Head: Normocephalic and atraumatic.     Mouth/Throat:     Pharynx: No oropharyngeal exudate.  Eyes:     General: No scleral icterus.       Left eye: No discharge.     Pupils: Pupils are equal, round, and reactive to light.  Cardiovascular:     Rate and Rhythm: Normal rate and regular rhythm.     Heart sounds: Normal heart sounds. No murmur heard. No friction rub.  Pulmonary:     Effort: Pulmonary effort is normal. No respiratory distress.     Breath sounds: No stridor. No wheezing.  Abdominal:     General: Bowel sounds are normal. There is no  distension.     Palpations: Abdomen is soft. There is no mass.     Tenderness: There is no abdominal tenderness.  Musculoskeletal:        General: No deformity. Normal range of motion.     Cervical back: Normal range of motion and neck supple.  Lymphadenopathy:     Cervical: No cervical adenopathy.  Skin:    General: Skin is warm and dry.     Findings: No erythema or rash.  Neurological:     Mental Status: He is alert and oriented to person, place, and time. Mental status is at baseline.     Cranial Nerves: No cranial nerve deficit.     Coordination: Coordination normal.  Psychiatric:        Mood and Affect: Mood normal.    LABORATORY DATA:  I have reviewed the data as listed Lab Results  Component Value Date   WBC 12.2 (H) 04/13/2021   HGB 17.8 (H) 04/13/2021   HCT 51.8 04/13/2021   MCV 93.8 04/13/2021   PLT 251 04/13/2021   No results for input(s): NA, K, CL, CO2, GLUCOSE, BUN, CREATININE, CALCIUM, GFRNONAA, GFRAA, PROT, ALBUMIN, AST, ALT, ALKPHOS, BILITOT, BILIDIR, IBILI in the last 8760 hours. Surgical Pathology 07/02/2017 DIAGNOSIS:  A. ULCERS, ILEUM; COLD BIOPSY:  - SMALL BOWEL MUCOSA WITH FEATURES OF A HEALING MUCOSAL INJURY.  - NEGATIVE FOR DYSPLASIA AND MALIGNANCY.  B. TERMINAL ILEUM; COLD BIOPSY:  - NO PATHOLOGIC CHANGE.  C. ILEOCECAL OPENING; COLD BIOPSY:  - MILD CHRONIC ACTIVE ENTERITIS/COLITIS.  - NEGATIVE FOR DYSPLASIA AND MALIGNANCY.  D. COLON POLYP, CECUM; COLD BIOPSY:  - TUBULAR ADENOMA.  - NEGATIVE FOR HIGH-GRADE DYSPLASIA AND MALIGNANCY.  E. COLON, CECUM; COLD BIOPSY:  -NO PATHOLOGIC CHANGE.  F. ASCENDING COLON; COLD BIOPSY:  - NO PATHOLOGIC CHANGE.  G. TRANSVERSE COLON; COLD BIOPSY:  - NO PATHOLOGIC CHANGE.  H. DESCENDING COLON; COLD BIOPSY:  - NO PATHOLOGIC CHANGE.  I. SIGMOID COLON; COLD BIOPSY:  - SMALL LYMPHOID AGGREGATE AND HEMORRHAGE.  - NEGATIVE FOR COLITIS, DYSPLASIA AND MALIGNANCY.  J. RECTUM; COLD BIOPSY:  - NO PATHOLOGIC  CHANGE.    Negative for, Bcr-Abl, Jak2 exon 14 with reflex to CARL/MPL/Exon 12.  Normal and erythropoietin level at 8.4 Negative flow cytometry.  ASSESSMENT & PLAN:  1. Secondary polycythemia   2. Tobacco abuse    polycythemia,secondary secondary to smoking and sleep apnea. Labs reviewed and are discussed  with patient. Hemoglobin 17.8, hematocrit 51.8 Proceed with phlebotomy today.  Tobacco use, patient is motivated in smoke cessation.  Encouraged his efforts. Chronic leukocytosis, likely due to smoking.  Patient will follow up in 6 months with repeat lab and possible phlebotomy   Earlie Server, MD, PhD Hematology Oncology [facility] Pager- [phone] 04/13/2021

## 2021-06-21 IMAGING — US US EXTREM LOW VENOUS*R*
1 series · 13 of 24 positions shown · non-contrast
Comparison: CT 03/17/2011.

CLINICAL DATA: Right calf pain. Varicose veins of right lower
extremity, unspecified whether complicated,

EXAM:
RIGHT LOWER EXTREMITY VENOUS DOPPLER ULTRASOUND
TECHNIQUE: Gray-scale sonography with compression, as well as color and duplex
ultrasound, were performed to evaluate the deep venous system(s)
from the level of the common femoral vein through the popliteal and
proximal calf veins.

[Series 1: us extrem low venous*right* · 0.07mm/px · 13 of 33 slices shown]
[im 1/33]
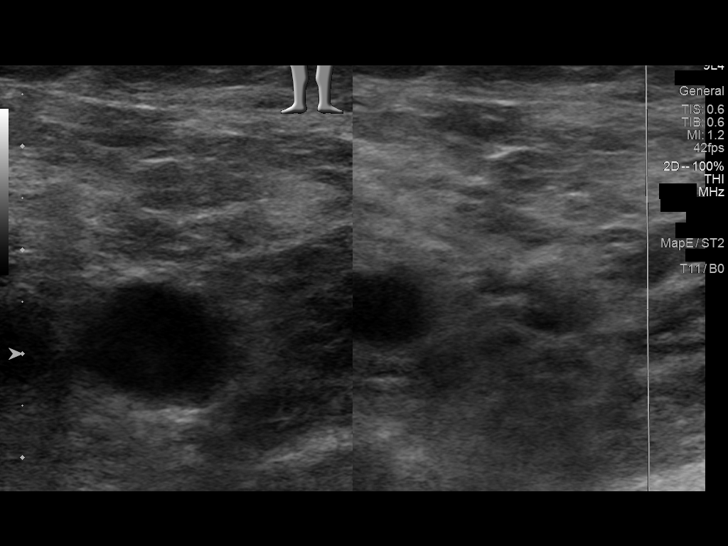
[im 3/33]
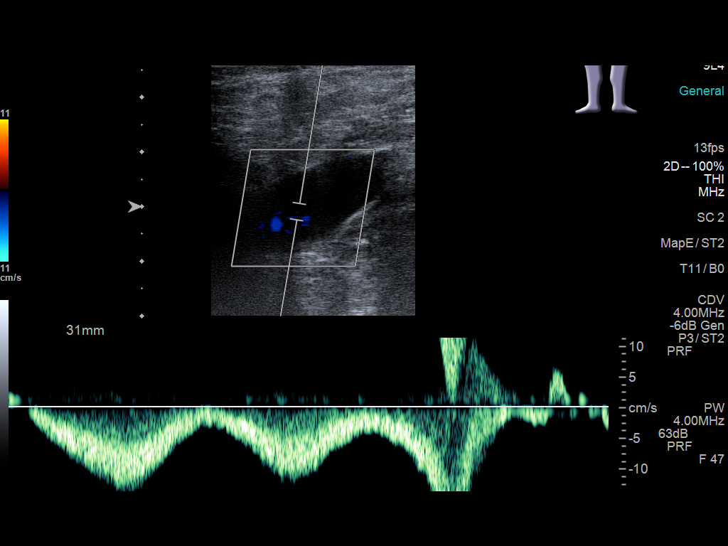
[im 6/33]
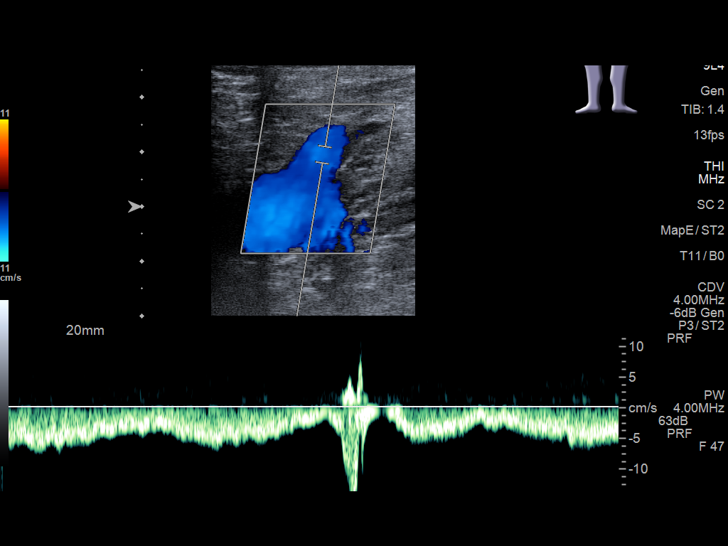
[im 9/33]
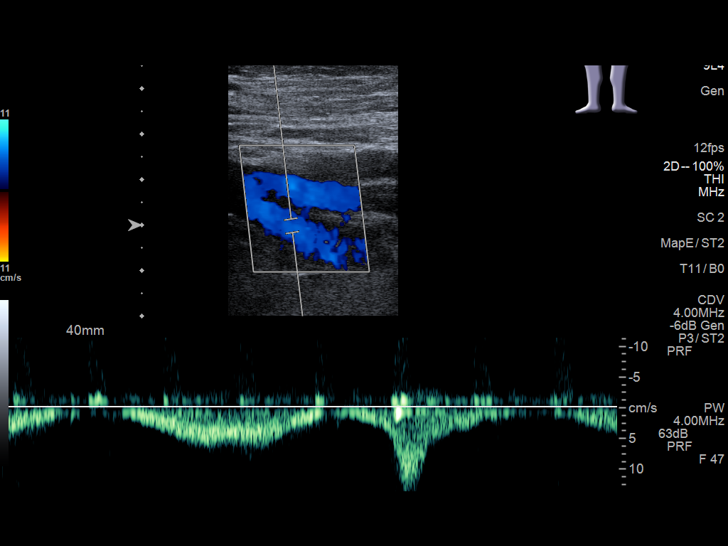
[im 12/33]
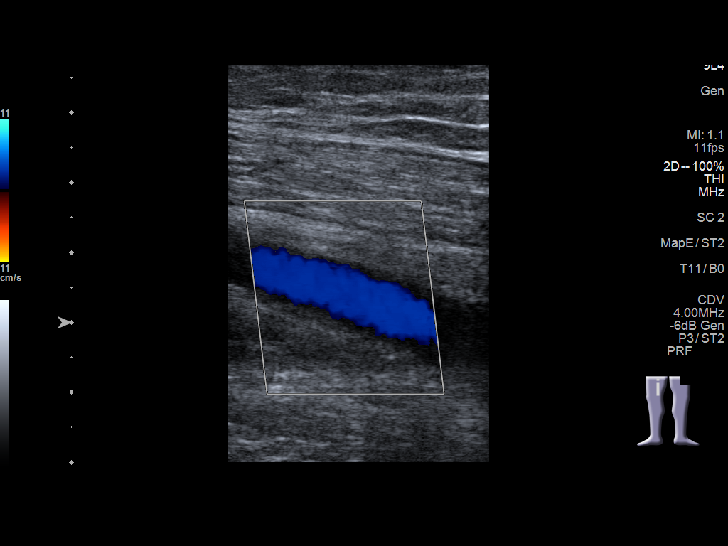
[im 14/33]
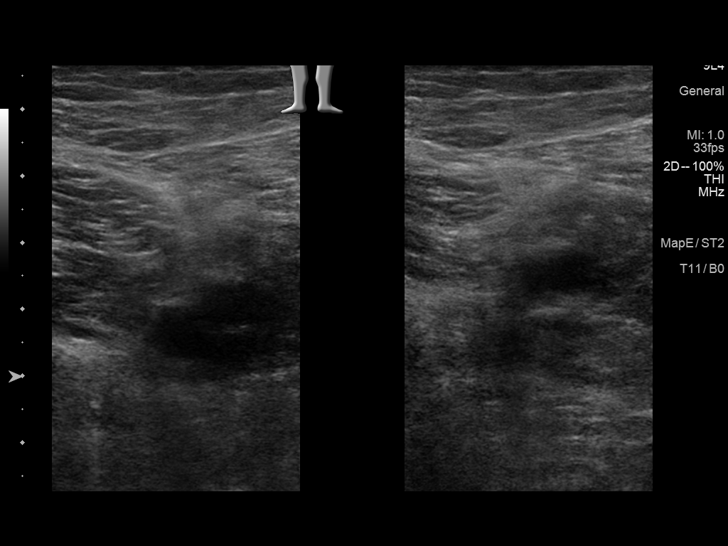
[im 17/33]
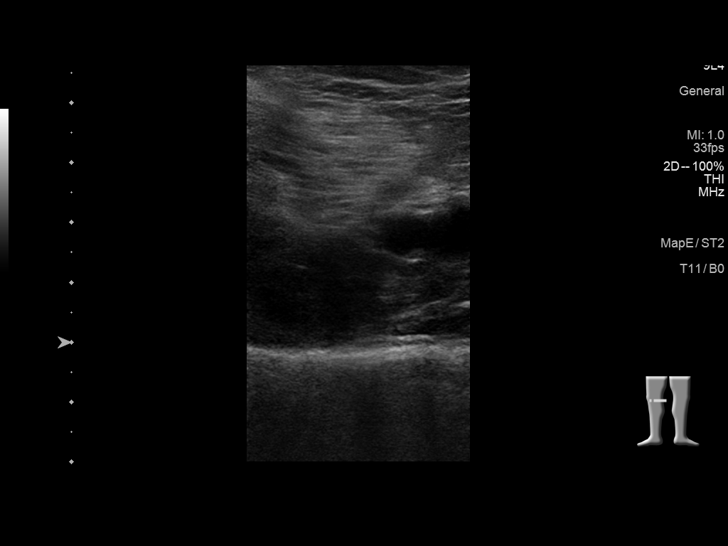
[im 19/33]
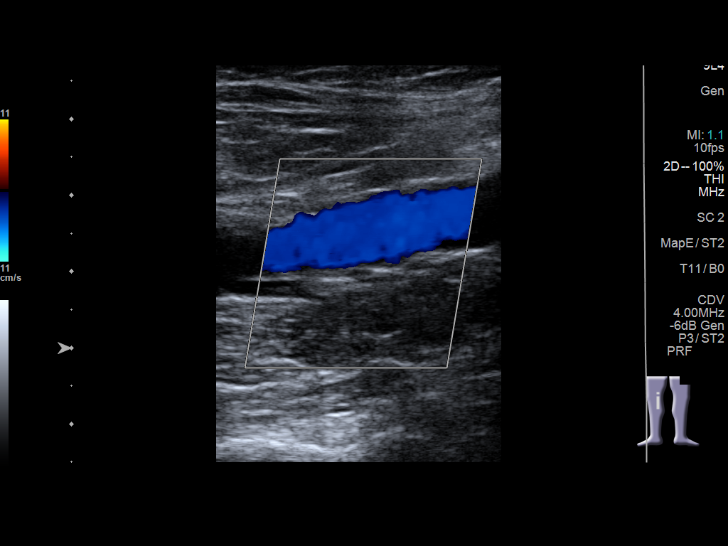
[im 21/33]
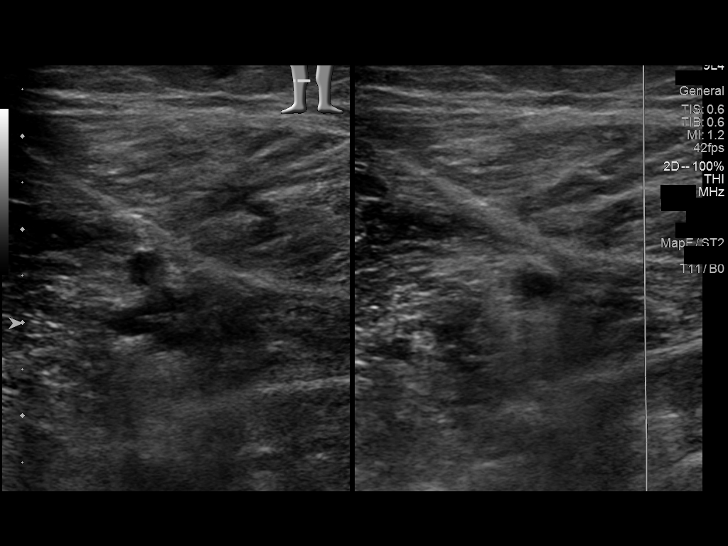
[im 24/33]
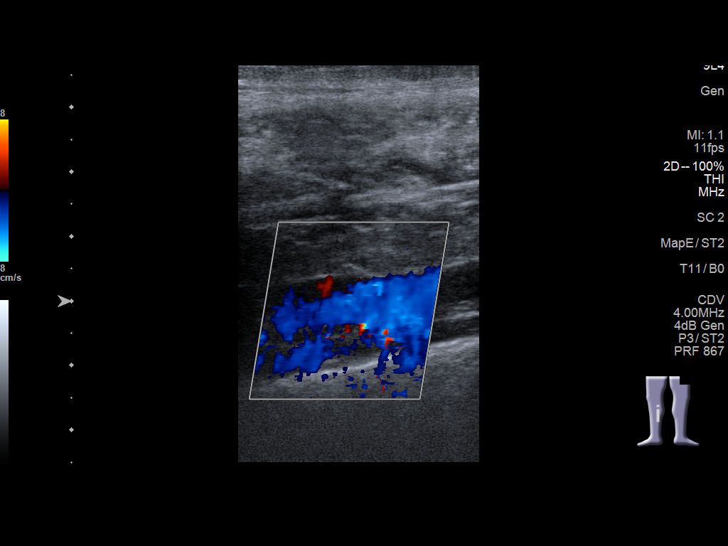
[im 27/33]
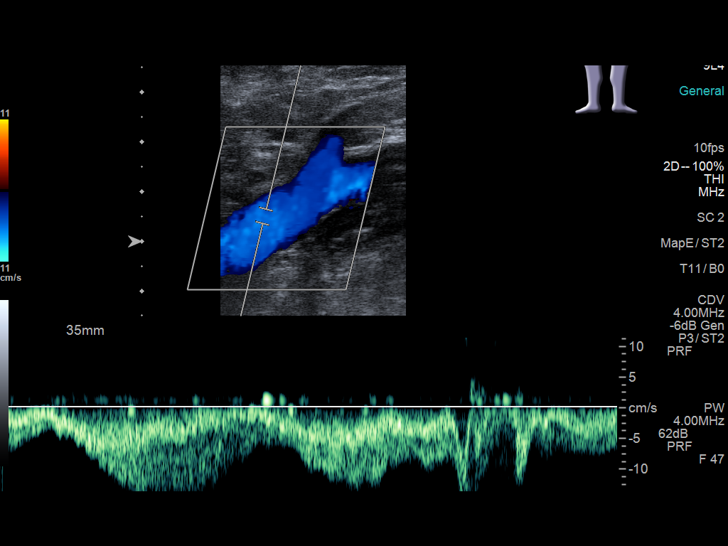
[im 30/33]
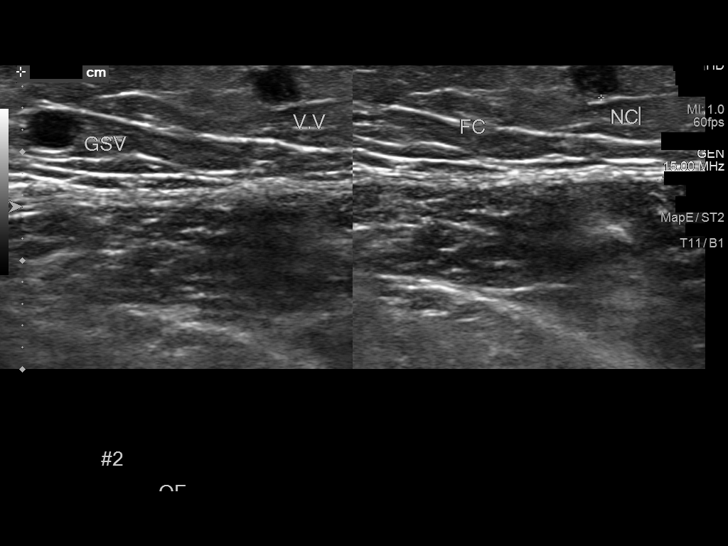
[im 33/33]
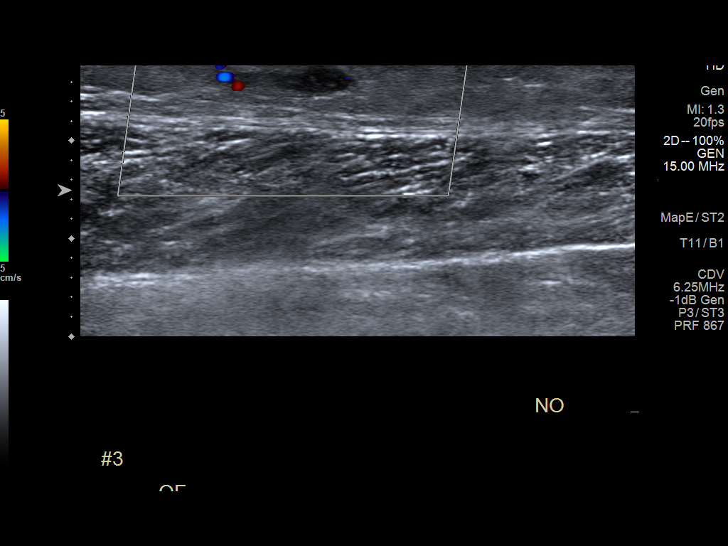

[13 of 24 positions shown; findings below may reference images not displayed]

FINDINGS: VENOUS

Normal compressibility of the common femoral, superficial femoral,
and popliteal veins, as well as the visualized calf veins.
Visualized portions of profunda femoral vein and great saphenous
vein unremarkable. No filling defects to suggest DVT on grayscale or
color Doppler imaging. Deep vein doppler waveforms show normal
direction of venous flow, normal respiratory plasticity and response
to augmentation. Note is made of 3 short-segment thrombosed
superficial varicose veins in the region the patient's symptoms.

Limited views of the contralateral common femoral vein are
unremarkable.

OTHER

None.

Limitations: none
IMPRESSION: 1.  No evidence of deep venous thrombosis.

2. Note is made of 3 short-segment thrombosed superficial varicose
veins in the region the patient's symptoms.

## 2021-10-12 ENCOUNTER — Inpatient Hospital Stay: Payer: 59 | Attending: Oncology

## 2021-10-12 ENCOUNTER — Inpatient Hospital Stay: Payer: 59

## 2021-10-12 ENCOUNTER — Inpatient Hospital Stay: Payer: 59 | Admitting: Oncology

## 2021-10-12 ENCOUNTER — Other Ambulatory Visit: Payer: Self-pay

## 2021-10-12 VITALS — BP 160/90 | HR 76 | Temp 98.2°F | Resp 18 | Wt 252.0 lb

## 2021-10-12 DIAGNOSIS — F17210 Nicotine dependence, cigarettes, uncomplicated: Secondary | ICD-10-CM | POA: Insufficient documentation

## 2021-10-12 DIAGNOSIS — Z7952 Long term (current) use of systemic steroids: Secondary | ICD-10-CM | POA: Diagnosis not present

## 2021-10-12 DIAGNOSIS — G4730 Sleep apnea, unspecified: Secondary | ICD-10-CM | POA: Insufficient documentation

## 2021-10-12 DIAGNOSIS — K5090 Crohn's disease, unspecified, without complications: Secondary | ICD-10-CM | POA: Insufficient documentation

## 2021-10-12 DIAGNOSIS — D582 Other hemoglobinopathies: Secondary | ICD-10-CM | POA: Diagnosis not present

## 2021-10-12 DIAGNOSIS — Z79899 Other long term (current) drug therapy: Secondary | ICD-10-CM | POA: Diagnosis not present

## 2021-10-12 DIAGNOSIS — D751 Secondary polycythemia: Secondary | ICD-10-CM | POA: Insufficient documentation

## 2021-10-12 LAB — CBC WITH DIFFERENTIAL/PLATELET
Abs Immature Granulocytes: 0.03 10*3/uL (ref 0.00–0.07)
Basophils Absolute: 0.1 10*3/uL (ref 0.0–0.1)
Basophils Relative: 1 %
Eosinophils Absolute: 0.2 10*3/uL (ref 0.0–0.5)
Eosinophils Relative: 1 %
HCT: 51.7 % (ref 39.0–52.0)
Hemoglobin: 17.7 g/dL — ABNORMAL HIGH (ref 13.0–17.0)
Immature Granulocytes: 0 %
Lymphocytes Relative: 27 %
Lymphs Abs: 3.2 10*3/uL (ref 0.7–4.0)
MCH: 32 pg (ref 26.0–34.0)
MCHC: 34.2 g/dL (ref 30.0–36.0)
MCV: 93.5 fL (ref 80.0–100.0)
Monocytes Absolute: 1 10*3/uL (ref 0.1–1.0)
Monocytes Relative: 9 %
Neutro Abs: 7.1 10*3/uL (ref 1.7–7.7)
Neutrophils Relative %: 62 %
Platelets: 259 10*3/uL (ref 150–400)
RBC: 5.53 MIL/uL (ref 4.22–5.81)
RDW: 13.5 % (ref 11.5–15.5)
WBC: 11.6 10*3/uL — ABNORMAL HIGH (ref 4.0–10.5)
nRBC: 0 % (ref 0.0–0.2)

## 2021-10-12 NOTE — Progress Notes (Signed)
Patient tolatered phlebotomy well today.  500 cc removed as ordered. HCT 51.7 today. No concerns voiced. Patient discharged, stable.

## 2021-10-12 NOTE — Progress Notes (Deleted)
Patient tolatered phlebotomy well today.  500 cc removed as ordered. HCT 51.7 today. No concerns voiced. Patient discharged, stable.

## 2021-10-12 NOTE — Patient Instructions (Signed)
Therapeutic Phlebotomy, Care After This sheet gives you information about how to care for yourself after your procedure. Your health care provider may also give you more specific instructions. If you have problems or questions, contact your health care provider. What can I expect after the procedure? After the procedure, it is common to have: Light-headedness or dizziness. You may feel faint. Nausea. Tiredness (fatigue). Follow these instructions at home: Eating and drinking Be sure to eat well-balanced meals for the next 24 hours. Drink enough fluid to keep your urine pale yellow. Avoid drinking alcohol on the day that you had the procedure. Activity  Return to your normal activities as told by your health care provider. Most people can go back to their normal activities right away. Avoid activities that take a lot of effort for about 5 hours after the procedure. Athletes should avoid strenuous exercise for at least 12 hours. Avoid heavy lifting or pulling for about 5 hours after the procedure. Do not lift anything that is heavier than 10 lb (4.5 kg). Change positions slowly for the remainder of the day. This will help to prevent light-headedness or fainting. If you feel light-headed, lie down until the feeling goes away. Needle insertion site care  Keep your bandage (dressing) dry. You can remove the bandage after about 5 hours or as told by your health care provider. If you have bleeding from the needle insertion site, raise (elevate) your arm and press firmly on the site until the bleeding stops. If you have bruising at the site, apply ice to the area: Remove the dressing. Put ice in a plastic bag. Place a towel between your skin and the bag. Leave the ice on for 20 minutes, 2-3 times a day for the first 24 hours. If the swelling does not go away after 24 hours, apply a warm, moist cloth (warm compress) to the area for 20 minutes, 2-3 times a day. General instructions Do not use any  products that contain nicotine or tobacco, such as cigarettes and e-cigarettes, for at least 30 minutes after the procedure. Keep all follow-up visits as told by your health care provider. This is important. You may need to continue having regular therapeutic phlebotomy treatments as directed. Contact a health care provider if you: Have redness, swelling, or pain at the needle insertion site. Have fluid or blood coming from the needle insertion site. Have pus or a bad smell coming from the needle insertion site. Notice that the needle insertion site feels warm to the touch. Feel light-headed, dizzy, or nauseous, and the feeling does not go away. Have new bruising at the needle insertion site. Feel weaker than normal. Have a fever or chills. Get help right away if: You faint. You have chest pain. You have trouble breathing. You have severe nausea or vomiting. Summary After the procedure, it is common to have some light-headedness, dizziness, nausea, or tiredness (fatigue). Be sure to eat well-balanced meals for the next 24 hours. Drink enough fluid to keep your urine pale yellow. Return to your normal activities as told by your health care provider. Keep all follow-up visits as told by your health care provider. You may need to continue having regular therapeutic phlebotomy treatments as directed. This information is not intended to replace advice given to you by your health care provider. Make sure you discuss any questions you have with your health care provider. Document Revised: 03/15/2021 Document Reviewed: 12/20/2017 Elsevier Patient Education  2022 [employer].

## 2021-10-12 NOTE — Progress Notes (Signed)
Hematology/Oncology Follow Up Note [facility] Telephone:([phone] [phone]   Patient Care Team: [name], MD as PCP - General (Internal Medicine) Earlie Server, MD as Consulting Physician (Oncology)  REFERRING PROVIDER: Dorian Furnace, Gastroenterology.  REASON FOR VISIT Follow up for treatment of secondary polycythemia.   HISTORY OF PRESENTING ILLNESS:  Todd Shepherd 50 y.o.  male with PMH listed as below who was referred by gastroenterology to me for evaluation of persistent leukocytosis and elevated hemoglobin.  Extensive medical records from both [place] were reviewed and HemOnc related problems and patient's complaints are listed as following  1 Crohn's disease: he has been on Humira 59m [place] weekly and Budesonide 671mPO daily. His Surveillance colonoscopy was  done 07/02/17 and demonstrated an adenoma. Per GI, the TI contained some small ulcers without signs of bleeding and mucosa in between ulcers normal. The IC valve was open and not narrowed. It was noted that the exam was otherwise normal. Sveral biopsies were taken: the ileal ulcers were noted to have hte characteristics of healing mucosal injury. There was mild active chronic colitis at the ileocecal opening. The TI, cecum, ascending, tranverse, descending, sigmoid colon, and rectum did not have any colitis/pathologic change. There was a small lymphoid aggregate in the sigmoid colon. There was no dysplasia/malignancy.  2 Persistent leukocytosis: wbc  was 10.8 on 07/12/2017, 12.8 on 07/23/2017 and 13.9 on 08/28/2017, ANC 8.98. Basophil 0.13,   3 Persistent elevated hemoglobin: Hb was 19.4, Hct 55.5 on 07/12/2017, Hb was 12.8 Hct  52.6 on 07/23/2017, Hb was 18.6 and Hct was 52.8 on 08/28/2017  4 Patient reports that he was seen by physician 10-15 years ago due to elevated blood counts and lab work up was done. He can not remember details of his diagnosis. He did not get  any phlebotomy or medication for that condition. He has itchiness, which is chronic, intermittent, usually triggered after hot shower.  Denies any history of blood clot.     5 patient initially reported that he does not smoke cigarettes. After I got his preliminary lab work which showed high carbomonoxide level and I called him with further questioning he reports that he smokes cigar. He was advised to quit smoking cigars.  His home furnace was recently checked and there was no gas leak.  -Patient has not been started on sleep apnea treatment with CPAP.  Per note, he was advised to use CPAP and he declined.  # Crohn's disease, patient is on Humira subcutaneously and budesonide 3 mg p.o.  He follows up with gastroenterology   Interval history-patient presents to clinic for follow-up for management of his erythrocytosis/polycythemia.  He receives intermittent phlebotomies approximately every 6 months for hematocrit greater than 50.  He continues to smoke cigarettes although he has decreased to 4 cigarettes daily.  Overall feels well.  ROS:  Review of Systems  Constitutional: Negative.  Negative for appetite change, chills, fatigue and fever.  HENT:  Negative.  Negative for hearing loss, lump/mass, mouth sores and nosebleeds.   Eyes: Negative.  Negative for eye problems.  Respiratory:  Negative for cough, hemoptysis and shortness of breath.   Cardiovascular: Negative.  Negative for chest pain and leg swelling.  Gastrointestinal: Negative.  Negative for abdominal pain, blood in stool, constipation, diarrhea, nausea and vomiting.  Endocrine: Negative.  Negative for hot flashes.  Genitourinary: Negative.  Negative for bladder incontinence, difficulty urinating, dysuria, frequency and hematuria.   Musculoskeletal: Negative.  Negative for back pain, flank pain, gait problem and myalgias.  Skin: Negative.  Negative for itching and rash.  Neurological: Negative.  Negative for dizziness, gait problem,  headaches, light-headedness and numbness.  Hematological: Negative.  Negative for adenopathy.  Psychiatric/Behavioral:  Negative for confusion. The patient is not nervous/anxious.    MEDICAL HISTORY:  Past Medical History:  Diagnosis Date   Atypical nevus x 2 11/03/2016   Right Abd Sup-Moderate(punch), and Right Abd Inf-Mild   Benign hypertension    Crohn's disease ([place])    GERD (gastroesophageal reflux disease)    Hyperlipidemia     SURGICAL HISTORY: Past Surgical History:  Procedure Laterality Date   COLONOSCOPY WITH PROPOFOL N/A 07/02/2017   Procedure: COLONOSCOPY WITH PROPOFOL;  Surgeon: Lollie Sails, MD;  Location: [facility] ENDOSCOPY;  Service: Endoscopy;  Laterality: N/A;   colonoscopys     x6   ESOPHAGOGASTRODUODENOSCOPY     TONSILLECTOMY      SOCIAL HISTORY: Social History   Socioeconomic History   Marital status: Divorced    Spouse name: Not on file   Number of children: Not on file   Years of education: Not on file   Highest education level: Not on file  Occupational History   Not on file  Tobacco Use   Smoking status: Some Days    Types: Cigars   Smokeless tobacco: Never  Vaping Use   Vaping Use: Never used  Substance and Sexual Activity   Alcohol use: No   Drug use: No   Sexual activity: Not on file  Other Topics Concern   Not on file  Social History Narrative   Not on file   Social Determinants of Health   Financial Resource Strain: Not on file  Food Insecurity: Not on file  Transportation Needs: Not on file  Physical Activity: Not on file  Stress: Not on file  Social Connections: Not on file  Intimate Partner Violence: Not on file    FAMILY HISTORY: Family History  Problem Relation Age of Onset   Diabetes Mother    Hypertension Father    Hypertension Maternal Grandmother    Diabetes Paternal Grandmother    Hypertension Paternal Grandmother    Anemia Paternal Grandfather     ALLERGIES:  is allergic to amoxicillin, levaquin  [levofloxacin], and mercaptopurine.  MEDICATIONS:  Current Outpatient Medications  Medication Sig Dispense Refill   ACIDOPHILUS LACTOBACILLUS PO Take by mouth.      Adalimumab 40 MG/0.8ML PNKT Inject into the skin once a week.      Ascorbic Acid (VITAMIN C) 1000 MG tablet Take 1,000 mg by mouth daily.     bisoprolol (ZEBETA) 5 MG tablet Take 5 mg by mouth daily.     budesonide (ENTOCORT EC) 3 MG 24 hr capsule Take 2 mg daily by mouth.      Multiple Vitamin (MULTIVITAMIN) tablet Take 1 tablet by mouth daily.     Omega-3 Fatty Acids (OMEGA-3 FISH OIL PO) Take by mouth.     pantoprazole (PROTONIX) 40 MG tablet Take 40 mg by mouth daily.     Probiotic Product (PROBIOTIC-10 PO) Take by mouth.     zinc gluconate 50 MG tablet Take 50 mg by mouth daily.     fluticasone (FLONASE) 50 MCG/ACT nasal spray Place into both nostrils. (Patient not taking: No sig reported)     rosuvastatin (CRESTOR) 10 MG tablet Take by mouth.     No current facility-administered medications for this visit.      [name]  PHYSICAL EXAMINATION: ECOG PERFORMANCE STATUS: 0 - Asymptomatic Vitals:   10/12/21 1418 10/12/21 1500  BP: (!) 149/86 (!) 160/90  Pulse: 74 76  Resp: 18 18  Temp: (!) 97.2 F (36.2 C) 98.2 F (36.8 C)  SpO2: 100% 97%   Filed Weights   10/12/21 1418  Weight: 252 lb (114.3 kg)   Physical Exam Constitutional:      Appearance: Normal appearance.  HENT:     Head: Normocephalic and atraumatic.  Eyes:     Pupils: Pupils are equal, round, and reactive to light.  Cardiovascular:     Rate and Rhythm: Normal rate and regular rhythm.     Heart sounds: Normal heart sounds. No murmur heard. Pulmonary:     Effort: Pulmonary effort is normal.     Breath sounds: Normal breath sounds. No wheezing.  Abdominal:     General: Bowel sounds are normal. There is no distension.     Palpations: Abdomen is soft.     Tenderness: There is no abdominal tenderness.  Musculoskeletal:        General: Normal range  of motion.     Cervical back: Normal range of motion.  Skin:    General: Skin is warm and dry.     Findings: No rash.  Neurological:     Mental Status: He is alert and oriented to person, place, and time.  Psychiatric:        Judgment: Judgment normal.   LABORATORY DATA:  I have reviewed the data as listed Lab Results  Component Value Date   WBC 11.6 (H) 10/12/2021   HGB 17.7 (H) 10/12/2021   HCT 51.7 10/12/2021   MCV 93.5 10/12/2021   PLT 259 10/12/2021   No results for input(s): NA, K, CL, CO2, GLUCOSE, BUN, CREATININE, CALCIUM, GFRNONAA, GFRAA, PROT, ALBUMIN, AST, ALT, ALKPHOS, BILITOT, BILIDIR, IBILI in the last 8760 hours. Surgical Pathology 07/02/2017 DIAGNOSIS:  A.  ULCERS, ILEUM; COLD BIOPSY:  - SMALL BOWEL MUCOSA WITH FEATURES OF A HEALING MUCOSAL INJURY.  - NEGATIVE FOR DYSPLASIA AND MALIGNANCY.  B.  TERMINAL ILEUM; COLD BIOPSY:  - NO PATHOLOGIC CHANGE.  C.  ILEOCECAL OPENING; COLD BIOPSY:  - MILD CHRONIC ACTIVE ENTERITIS/COLITIS.  - NEGATIVE FOR DYSPLASIA AND MALIGNANCY.  D.  COLON POLYP, CECUM; COLD BIOPSY:  - TUBULAR ADENOMA.  - NEGATIVE FOR HIGH-GRADE DYSPLASIA AND MALIGNANCY.  E.  COLON, CECUM; COLD BIOPSY:  - NO PATHOLOGIC CHANGE.  F.  ASCENDING COLON; COLD BIOPSY:  - NO PATHOLOGIC CHANGE.  G.  TRANSVERSE COLON; COLD BIOPSY:  - NO PATHOLOGIC CHANGE.  H.  DESCENDING COLON; COLD BIOPSY:  - NO PATHOLOGIC CHANGE.  I.  SIGMOID COLON; COLD BIOPSY:  - SMALL LYMPHOID AGGREGATE AND HEMORRHAGE.  - NEGATIVE FOR COLITIS, DYSPLASIA AND MALIGNANCY.  J.  RECTUM; COLD BIOPSY:  - NO PATHOLOGIC CHANGE.    Negative for, Bcr-Abl, Jak2 exon 14 with reflex to CARL/MPL/Exon 12.  Normal and erythropoietin level at 8.4 Negative flow cytometry.  ASSESSMENT & PLAN: Patient presents to clinic for routine follow-up for polycythemia.  1. High blood hemoglobin F (HCC)   2. Secondary polycythemia    Clinically he is doing well today.  Denies any new concerning symptoms.   Polycythemia secondary to smoking and sleep apnea.  Labs from today show a hemoglobin of 17.7 and hematocrit of 51.7.  Proceed with phlebotomy today.  RTC in 6 months for follow-up, labs and possible phlebotomy.   I spent 20 minutes dedicated to the care of this  patient (face-to-face and non-face-to-face) on the date of the encounter to include what is described in the assessment and plan.  Faythe Casa, NP 10/12/2021 3:50 PM

## 2022-04-13 ENCOUNTER — Encounter: Payer: Self-pay | Admitting: Nurse Practitioner

## 2022-04-13 ENCOUNTER — Inpatient Hospital Stay: Payer: 59

## 2022-04-13 ENCOUNTER — Inpatient Hospital Stay: Payer: 59 | Attending: Oncology

## 2022-04-13 ENCOUNTER — Inpatient Hospital Stay: Payer: 59 | Admitting: Nurse Practitioner

## 2022-04-13 VITALS — BP 138/80 | HR 79

## 2022-04-13 VITALS — BP 158/90 | HR 84 | Temp 97.0°F | Resp 18 | Wt 258.0 lb

## 2022-04-13 DIAGNOSIS — G4730 Sleep apnea, unspecified: Secondary | ICD-10-CM | POA: Insufficient documentation

## 2022-04-13 DIAGNOSIS — K5090 Crohn's disease, unspecified, without complications: Secondary | ICD-10-CM | POA: Diagnosis not present

## 2022-04-13 DIAGNOSIS — Z79620 Long term (current) use of immunosuppressive biologic: Secondary | ICD-10-CM | POA: Diagnosis not present

## 2022-04-13 DIAGNOSIS — Z79899 Other long term (current) drug therapy: Secondary | ICD-10-CM | POA: Diagnosis not present

## 2022-04-13 DIAGNOSIS — D751 Secondary polycythemia: Secondary | ICD-10-CM

## 2022-04-13 DIAGNOSIS — D582 Other hemoglobinopathies: Secondary | ICD-10-CM

## 2022-04-13 DIAGNOSIS — Z7952 Long term (current) use of systemic steroids: Secondary | ICD-10-CM | POA: Diagnosis not present

## 2022-04-13 LAB — CBC WITH DIFFERENTIAL/PLATELET
Abs Immature Granulocytes: 0.03 10*3/uL (ref 0.00–0.07)
Basophils Absolute: 0.1 10*3/uL (ref 0.0–0.1)
Basophils Relative: 1 %
Eosinophils Absolute: 0.3 10*3/uL (ref 0.0–0.5)
Eosinophils Relative: 3 %
HCT: 49.5 % (ref 39.0–52.0)
Hemoglobin: 17.2 g/dL — ABNORMAL HIGH (ref 13.0–17.0)
Immature Granulocytes: 0 %
Lymphocytes Relative: 31 %
Lymphs Abs: 3.5 10*3/uL (ref 0.7–4.0)
MCH: 32.3 pg (ref 26.0–34.0)
MCHC: 34.7 g/dL (ref 30.0–36.0)
MCV: 93 fL (ref 80.0–100.0)
Monocytes Absolute: 1.1 10*3/uL — ABNORMAL HIGH (ref 0.1–1.0)
Monocytes Relative: 10 %
Neutro Abs: 6.2 10*3/uL (ref 1.7–7.7)
Neutrophils Relative %: 55 %
Platelets: 279 10*3/uL (ref 150–400)
RBC: 5.32 MIL/uL (ref 4.22–5.81)
RDW: 13.4 % (ref 11.5–15.5)
WBC: 11.2 10*3/uL — ABNORMAL HIGH (ref 4.0–10.5)
nRBC: 0 % (ref 0.0–0.2)

## 2022-04-13 NOTE — Patient Instructions (Signed)

## 2022-04-13 NOTE — Progress Notes (Signed)
500 ml  blood removed per phlebotomy parameters.Tolerated procedure well. VSS. Accepted a beveage. Discharged to home. ?

## 2022-04-13 NOTE — Progress Notes (Signed)
?Hematology/Oncology Follow Up Note ?Todd Shepherd ?[phone] [phone] ? ? ?Patient Care Team: ?[name], MD as PCP - General (Internal Medicine) ?Earlie Server, MD as Consulting Physician (Oncology) ? ?REFERRING PROVIDER: ?Dorian Furnace, Gastroenterology. ? ?REASON FOR VISIT ?Follow up for treatment of secondary polycythemia.  ? ?HISTORY OF PRESENTING ILLNESS:  ?Todd Shepherd 51 y.o.  male with PMH listed as below who was referred by gastroenterology to me for evaluation of persistent leukocytosis and elevated hemoglobin.  Extensive medical records from both [facility] and [place] were reviewed and HemOnc related problems and patient's complaints are listed as following ? ?1 Crohn's disease: he has been on Humira 39m [place] weekly and Budesonide 666mPO daily. His Surveillance colonoscopy was  done 07/02/17 and demonstrated an adenoma. Per GI, the TI contained some small ulcers without signs of bleeding and mucosa in between ulcers normal. The IC valve was open and not narrowed. It was noted that the exam was otherwise normal. Sveral biopsies were taken: the ileal ulcers were noted to have hte characteristics of healing mucosal injury. There was mild active chronic colitis at the ileocecal opening. The TI, cecum, ascending, tranverse, descending, sigmoid colon, and rectum did not have any colitis/pathologic change. There was a small lymphoid aggregate in the sigmoid colon. There was no dysplasia/malignancy. ? ?2 Persistent leukocytosis: wbc  was 10.8 on 07/12/2017, 12.8 on 07/23/2017 and 13.9 on 08/28/2017, ANC 8.98. Basophil 0.13,  ? ?3 Persistent elevated hemoglobin: Hb was 19.4, Hct 55.5 on 07/12/2017, Hb was 12.8 Hct  52.6 on 07/23/2017, Hb was 18.6 and Hct was 52.8 on 08/28/2017 ? ?4 Patient reports that he was seen by physician 10-15 years ago due to elevated blood counts and lab work up was done. He can not remember details of his diagnosis. He did not get  any phlebotomy or medication for that condition. He has itchiness, which is chronic, intermittent, usually triggered after hot shower.  ?Denies any history of blood clot.  ? ?  5 patient initially reported that he does not smoke cigarettes. After I got his preliminary lab work which showed high carbomonoxide level and I called him with further questioning he reports that he smokes cigar. He was advised to quit smoking cigars.  His home furnace was recently checked and there was no gas leak. ? ?-Patient has not been started on sleep apnea treatment with CPAP.  Per note, he was advised to use CPAP and he declined. ? ?# Crohn's disease, patient is on Humira subcutaneously and budesonide 3 mg p.o.  He follows up with gastroenterology ? ? ?Interval history-patient presents to clinic for follow-up for polycythemia, secondary to smoking and sleep apnea. He reports feeling well. Denies joint aches and pains. Continues to cut down on smoking.  ? ?ROS:  ?Review of Systems  ?Constitutional:  Negative for appetite change, fatigue and unexpected weight change.  ?HENT:   Negative for mouth sores, sore throat and trouble swallowing.   ?Respiratory:  Negative for chest tightness and shortness of breath.   ?Cardiovascular:  Negative for leg swelling.  ?Gastrointestinal:  Negative for abdominal pain, constipation, diarrhea, nausea and vomiting.  ?Genitourinary:  Negative for bladder incontinence and dysuria.   ?Musculoskeletal:  Negative for flank pain and neck stiffness.  ?Skin:  Negative for itching, rash and wound.  ?Neurological:  Negative for dizziness, headaches, light-headedness and numbness.  ?Hematological:  Negative for adenopathy. Does not bruise/bleed easily.  ?Psychiatric/Behavioral:  Negative for confusion, depression  and sleep disturbance. The patient is not nervous/anxious.   ? ?MEDICAL HISTORY:  ?Past Medical History:  ?Diagnosis Date  ? Atypical nevus x 2 11/03/2016  ? Right Abd Sup-Moderate(punch), and Right Abd  Inf-Mild  ? Benign hypertension   ? Crohn's disease ([place])   ? GERD (gastroesophageal reflux disease)   ? Hyperlipidemia   ? ? ?SURGICAL HISTORY: ?Past Surgical History:  ?Procedure Laterality Date  ? COLONOSCOPY WITH PROPOFOL N/A 07/02/2017  ? Procedure: COLONOSCOPY WITH PROPOFOL;  Surgeon: Lollie Sails, MD;  Location: [facility] ENDOSCOPY;  Service: Endoscopy;  Laterality: N/A;  ? colonoscopys    ? x6  ? ESOPHAGOGASTRODUODENOSCOPY    ? TONSILLECTOMY    ? ? ?SOCIAL HISTORY: ?Social History  ? ?Socioeconomic History  ? Marital status: Divorced  ?  Spouse name: Not on file  ? Number of children: Not on file  ? Years of education: Not on file  ? Highest education level: Not on file  ?Occupational History  ? Not on file  ?Tobacco Use  ? Smoking status: Some Days  ?  Types: Cigars  ? Smokeless tobacco: Never  ?Vaping Use  ? Vaping Use: Never used  ?Substance and Sexual Activity  ? Alcohol use: No  ? Drug use: No  ? Sexual activity: Not on file  ?Other Topics Concern  ? Not on file  ?Social History Narrative  ? Not on file  ? ?Social Determinants of Health  ? ?Financial Resource Strain: Not on file  ?Food Insecurity: Not on file  ?Transportation Needs: Not on file  ?Physical Activity: Not on file  ?Stress: Not on file  ?Social Connections: Not on file  ?Intimate Partner Violence: Not on file  ? ? ?FAMILY HISTORY: ?Family History  ?Problem Relation Age of Onset  ? Diabetes Mother   ? Hypertension Father   ? Hypertension Maternal Grandmother   ? Diabetes Paternal Grandmother   ? Hypertension Paternal Grandmother   ? Anemia Paternal Grandfather   ? ? ?ALLERGIES:  is allergic to amoxicillin, levaquin [levofloxacin], and mercaptopurine. ? ?MEDICATIONS:  ?Current Outpatient Medications  ?Medication Sig Dispense Refill  ? ACIDOPHILUS LACTOBACILLUS PO Take by mouth.     ? Adalimumab 40 MG/0.8ML PNKT Inject into the skin once a week.     ? Ascorbic Acid (VITAMIN C) 1000 MG tablet Take 1,000 mg by mouth daily.    ? bisoprolol  (ZEBETA) 5 MG tablet Take 5 mg by mouth daily.    ? budesonide (ENTOCORT EC) 3 MG 24 hr capsule Take 2 mg daily by mouth.     ? Multiple Vitamin (MULTIVITAMIN) tablet Take 1 tablet by mouth daily.    ? Omega-3 Fatty Acids (OMEGA-3 FISH OIL PO) Take by mouth.    ? pantoprazole (PROTONIX) 40 MG tablet Take 40 mg by mouth daily.    ? Probiotic Product (PROBIOTIC-10 PO) Take by mouth.    ? zinc gluconate 50 MG tablet Take 50 mg by mouth daily.    ? rosuvastatin (CRESTOR) 10 MG tablet Take by mouth.    ? ?No current facility-administered medications for this visit.  ?. ? ?PHYSICAL EXAMINATION: ?ECOG PERFORMANCE STATUS: 0 - Asymptomatic ?Vitals:  ? 04/13/22 1343  ?BP: (!) 158/90  ?Pulse: 84  ?Resp: 18  ?Temp: (!) 97 ?F (36.1 ?C)  ?SpO2: 99%  ? ?Filed Weights  ? 04/13/22 1343  ?Weight: 258 lb (117 kg)  ? ?Physical Exam ?Vitals reviewed.  ?Constitutional:   ?   Appearance: He is obese. He  is not ill-appearing.  ?Pulmonary:  ?   Effort: No respiratory distress.  ?Skin: ?   Coloration: Skin is not pale.  ?   Comments: Ruddy skin tone  ?Neurological:  ?   Mental Status: He is alert and oriented to person, place, and time.  ?Psychiatric:     ?   Mood and Affect: Mood normal.     ?   Behavior: Behavior normal.  ? ?LABORATORY DATA:  ?I have reviewed the data as listed ?Lab Results  ?Component Value Date  ? WBC 11.2 (H) 04/13/2022  ? HGB 17.2 (H) 04/13/2022  ? HCT 49.5 04/13/2022  ? MCV 93.0 04/13/2022  ? PLT 279 04/13/2022  ? ?No results for input(s): NA, K, CL, CO2, GLUCOSE, BUN, CREATININE, CALCIUM, GFRNONAA, GFRAA, PROT, ALBUMIN, AST, ALT, ALKPHOS, BILITOT, BILIDIR, IBILI in the last 8760 hours. ?Surgical Pathology 07/02/2017 ?DIAGNOSIS:  ?A.  ULCERS, ILEUM; COLD BIOPSY:  ?- SMALL BOWEL MUCOSA WITH FEATURES OF A HEALING MUCOSAL INJURY.  ?- NEGATIVE FOR DYSPLASIA AND MALIGNANCY.  ?B.  TERMINAL ILEUM; COLD BIOPSY:  ?- NO PATHOLOGIC CHANGE.  ?C.  ILEOCECAL OPENING; COLD BIOPSY:  ?- MILD CHRONIC ACTIVE ENTERITIS/COLITIS.  ?-  NEGATIVE FOR DYSPLASIA AND MALIGNANCY.  ?D.  COLON POLYP, CECUM; COLD BIOPSY:  ?- TUBULAR ADENOMA.  ?- NEGATIVE FOR HIGH-GRADE DYSPLASIA AND MALIGNANCY.  ?E.  COLON, CECUM; COLD BIOPSY:  ?- NO PATHOLOGIC CHANGE.  ?F.

## 2022-04-13 NOTE — Progress Notes (Signed)
Patient denies any concerns today.  

## 2022-07-12 ENCOUNTER — Other Ambulatory Visit: Payer: Self-pay

## 2022-07-12 DIAGNOSIS — D582 Other hemoglobinopathies: Secondary | ICD-10-CM

## 2022-07-12 DIAGNOSIS — D751 Secondary polycythemia: Secondary | ICD-10-CM

## 2022-07-13 ENCOUNTER — Inpatient Hospital Stay: Payer: 59

## 2022-07-13 ENCOUNTER — Inpatient Hospital Stay: Payer: 59 | Attending: Nurse Practitioner

## 2022-10-13 ENCOUNTER — Inpatient Hospital Stay: Payer: 59 | Admitting: Oncology

## 2022-10-13 ENCOUNTER — Inpatient Hospital Stay: Payer: 59

## 2022-10-13 ENCOUNTER — Inpatient Hospital Stay: Payer: 59 | Attending: Oncology

## 2022-10-13 ENCOUNTER — Encounter: Payer: Self-pay | Admitting: Oncology

## 2022-10-13 ENCOUNTER — Inpatient Hospital Stay: Payer: 59 | Admitting: Nurse Practitioner

## 2022-10-13 VITALS — BP 137/88 | HR 75 | Temp 97.3°F | Resp 18 | Wt 243.8 lb

## 2022-10-13 DIAGNOSIS — D751 Secondary polycythemia: Secondary | ICD-10-CM | POA: Diagnosis present

## 2022-10-13 DIAGNOSIS — Z79899 Other long term (current) drug therapy: Secondary | ICD-10-CM | POA: Insufficient documentation

## 2022-10-13 DIAGNOSIS — F17210 Nicotine dependence, cigarettes, uncomplicated: Secondary | ICD-10-CM | POA: Insufficient documentation

## 2022-10-13 DIAGNOSIS — G4730 Sleep apnea, unspecified: Secondary | ICD-10-CM | POA: Insufficient documentation

## 2022-10-13 DIAGNOSIS — I10 Essential (primary) hypertension: Secondary | ICD-10-CM | POA: Insufficient documentation

## 2022-10-13 DIAGNOSIS — Z720 Tobacco use: Secondary | ICD-10-CM | POA: Diagnosis not present

## 2022-10-13 DIAGNOSIS — D582 Other hemoglobinopathies: Secondary | ICD-10-CM

## 2022-10-13 LAB — CBC WITH DIFFERENTIAL/PLATELET
Abs Immature Granulocytes: 0.02 10*3/uL (ref 0.00–0.07)
Basophils Absolute: 0.1 10*3/uL (ref 0.0–0.1)
Basophils Relative: 1 %
Eosinophils Absolute: 0.4 10*3/uL (ref 0.0–0.5)
Eosinophils Relative: 4 %
HCT: 50.1 % (ref 39.0–52.0)
Hemoglobin: 17.4 g/dL — ABNORMAL HIGH (ref 13.0–17.0)
Immature Granulocytes: 0 %
Lymphocytes Relative: 28 %
Lymphs Abs: 3.1 10*3/uL (ref 0.7–4.0)
MCH: 31.9 pg (ref 26.0–34.0)
MCHC: 34.7 g/dL (ref 30.0–36.0)
MCV: 91.9 fL (ref 80.0–100.0)
Monocytes Absolute: 1.1 10*3/uL — ABNORMAL HIGH (ref 0.1–1.0)
Monocytes Relative: 10 %
Neutro Abs: 6.3 10*3/uL (ref 1.7–7.7)
Neutrophils Relative %: 57 %
Platelets: 256 10*3/uL (ref 150–400)
RBC: 5.45 MIL/uL (ref 4.22–5.81)
RDW: 13.3 % (ref 11.5–15.5)
WBC: 10.9 10*3/uL — ABNORMAL HIGH (ref 4.0–10.5)
nRBC: 0 % (ref 0.0–0.2)

## 2022-10-13 NOTE — Assessment & Plan Note (Signed)
Smoking cessation discussed with patient and he is motivated.

## 2022-10-13 NOTE — Assessment & Plan Note (Signed)
Secondary erythrocytosis to smoking and sleep apnea. Labs reviewed and are discussed with patient. Hct 50.1. discussed about increasing Hct phlebotomy threhold to 52 and he agrees.  No phlebotomy today

## 2022-10-13 NOTE — Progress Notes (Signed)
Hematology/Oncology Progress note [phone] [phone]      Patient Care Team: [name], MD as PCP - General (Internal Medicine) Earlie Server, MD as Consulting Physician (Oncology)  ASSESSMENT & PLAN:   Erythrocytosis Secondary erythrocytosis to smoking and sleep apnea. Labs reviewed and are discussed with patient. Hct 50.1. discussed about increasing Hct phlebotomy threhold to 52 and he agrees.  No phlebotomy today  Tobacco use Smoking cessation discussed with patient and he is motivated.    Orders Placed This Encounter  Procedures   CBC with Differential/Platelet    Standing Status:   Future    Standing Expiration Date:   10/13/2023   Follow up in 6 months.  All questions were answered. The patient knows to call the clinic with any problems, questions or concerns.  Earlie Server, MD, PhD [facility] Hematology Oncology 10/13/2022    REASON FOR VISIT Follow up for treatment of secondary polycythemia.   HISTORY OF PRESENTING ILLNESS:  Todd Shepherd 51 y.o.  male with PMH listed as below who was referred by gastroenterology to me for evaluation of persistent leukocytosis and elevated hemoglobin.  Extensive medical records from both [place] were reviewed and HemOnc related problems and patient's complaints are listed as following  1 Crohn's disease: he has been on Humira 77m [place] weekly and Budesonide 698mPO daily. His Surveillance colonoscopy was  done 07/02/17 and demonstrated an adenoma. Per GI, the TI contained some small ulcers without signs of bleeding and mucosa in between ulcers normal. The IC valve was open and not narrowed. It was noted that the exam was otherwise normal. Sveral biopsies were taken: the ileal ulcers were noted to have hte characteristics of healing mucosal injury. There was mild active chronic colitis at the ileocecal opening. The TI, cecum, ascending, tranverse, descending, sigmoid colon, and rectum  did not have any colitis/pathologic change. There was a small lymphoid aggregate in the sigmoid colon. There was no dysplasia/malignancy.  2 Persistent leukocytosis: wbc  was 10.8 on 07/12/2017, 12.8 on 07/23/2017 and 13.9 on 08/28/2017, ANC 8.98. Basophil 0.13,   3 Persistent elevated hemoglobin: Hb was 19.4, Hct 55.5 on 07/12/2017, Hb was 12.8 Hct  52.6 on 07/23/2017, Hb was 18.6 and Hct was 52.8 on 08/28/2017  4 Patient reports that he was seen by physician 10-15 years ago due to elevated blood counts and lab work up was done. He can not remember details of his diagnosis. He did not get any phlebotomy or medication for that condition. He has itchiness, which is chronic, intermittent, usually triggered after hot shower.  Denies any history of blood clot.     5 patient initially reported that he does not smoke cigarettes. After I got his preliminary lab work which showed high carbomonoxide level and I called him with further questioning he reports that he smokes cigar. He was advised to quit smoking cigars.  His home furnace was recently checked and there was no gas leak.  -Patient has not been started on sleep apnea treatment with CPAP.  Per note, he was advised to use CPAP and he declined.  # Crohn's disease, patient is on Humira subcutaneously and budesonide 3 mg p.o.  He follows up with gastroenterology  Interval history [age].o. male presented for follow-up for the management of erythrocytosis/polycythemia. Patient has increased smoking recently due to going through stress of losing family member. Smokes 4 to 5 cigarettes daily.  Otherwise no new complaints.   ROS:  Review of Systems  Constitutional:  Negative for appetite change, chills, fatigue, fever and unexpected weight change.  HENT:   Negative for hearing loss, lump/mass and voice change.   Eyes:  Negative for eye problems and icterus.  Respiratory:  Negative for chest tightness, cough and shortness of breath.   Cardiovascular:   Negative for chest pain and leg swelling.  Gastrointestinal:  Negative for abdominal distention and abdominal pain.  Endocrine: Negative for hot flashes.  Genitourinary:  Negative for difficulty urinating, dysuria and frequency.   Musculoskeletal:  Negative for arthralgias, back pain and gait problem.  Skin:  Negative for itching and rash.  Neurological:  Negative for dizziness, gait problem, light-headedness and numbness.  Hematological:  Negative for adenopathy. Does not bruise/bleed easily.  Psychiatric/Behavioral:  Negative for confusion. The patient is not nervous/anxious.     MEDICAL HISTORY:  Past Medical History:  Diagnosis Date   Atypical nevus x 2 11/03/2016   Right Abd Sup-Moderate(punch), and Right Abd Inf-Mild   Benign hypertension    Crohn's disease ([place])    GERD (gastroesophageal reflux disease)    Hyperlipidemia     SURGICAL HISTORY: Past Surgical History:  Procedure Laterality Date   COLONOSCOPY WITH PROPOFOL N/A 07/02/2017   Procedure: COLONOSCOPY WITH PROPOFOL;  Surgeon: Lollie Sails, MD;  Location: [facility] ENDOSCOPY;  Service: Endoscopy;  Laterality: N/A;   colonoscopys     x6   ESOPHAGOGASTRODUODENOSCOPY     TONSILLECTOMY      SOCIAL HISTORY: Social History   Socioeconomic History   Marital status: Divorced    Spouse name: Not on file   Number of children: Not on file   Years of education: Not on file   Highest education level: Not on file  Occupational History   Not on file  Tobacco Use   Smoking status: Some Days    Types: Cigars   Smokeless tobacco: Never  Vaping Use   Vaping Use: Never used  Substance and Sexual Activity   Alcohol use: No   Drug use: No   Sexual activity: Not on file  Other Topics Concern   Not on file  Social History Narrative   Not on file   Social Determinants of Health   Financial Resource Strain: Not on file  Food Insecurity: Not on file  Transportation Needs: Not on file  Physical Activity: Not on file   Stress: Not on file  Social Connections: Not on file  Intimate Partner Violence: Not on file    FAMILY HISTORY: Family History  Problem Relation Age of Onset   Diabetes Mother    Hypertension Father    Hypertension Maternal Grandmother    Diabetes Paternal Grandmother    Hypertension Paternal Grandmother    Anemia Paternal Grandfather     ALLERGIES:  is allergic to amoxicillin, levaquin [levofloxacin], and mercaptopurine.  MEDICATIONS:  Current Outpatient Medications  Medication Sig Dispense Refill   ACIDOPHILUS LACTOBACILLUS PO Take by mouth.      Adalimumab 40 MG/0.8ML PNKT Inject into the skin once a week.      Ascorbic Acid (VITAMIN C) 1000 MG tablet Take 1,000 mg by mouth daily.     bisoprolol (ZEBETA) 5 MG tablet Take 5 mg by mouth daily.     budesonide (ENTOCORT EC) 3 MG 24 hr capsule Take 2 mg daily by mouth.      [employer] ADVANCED GLUCOSE TEST test strip TEST ONCE A DAY     [employer] Lancets Micro Thin 33G MISC Apply topically daily.  Lancet Devices (TRUEDRAW LANCING DEVICE) MISC daily.     Multiple Vitamin (MULTIVITAMIN) tablet Take 1 tablet by mouth daily.     Omega-3 Fatty Acids (OMEGA-3 FISH OIL PO) Take by mouth.     pantoprazole (PROTONIX) 40 MG tablet Take 40 mg by mouth daily.     Probiotic Product (PROBIOTIC-10 PO) Take by mouth.     RYBELSUS 14 MG TABS Take 1 tablet by mouth daily.     zinc gluconate 50 MG tablet Take 50 mg by mouth daily.     rosuvastatin (CRESTOR) 10 MG tablet Take by mouth. (Patient not taking: Reported on 10/13/2022)     No current facility-administered medications for this visit.      [name]  PHYSICAL EXAMINATION: ECOG PERFORMANCE STATUS: 0 - Asymptomatic Vitals:   10/13/22 0923  BP: 137/88  Pulse: 75  Resp: 18  Temp: (!) 97.3 F (36.3 C)  SpO2: 99%   Filed Weights   10/13/22 0923  Weight: 243 lb 12.8 oz (110.6 kg)   Physical Exam Constitutional:      General: He is not in acute distress. HENT:     Head: Normocephalic and  atraumatic.     Mouth/Throat:     Pharynx: No oropharyngeal exudate.  Eyes:     General: No scleral icterus.       Left eye: No discharge.     Pupils: Pupils are equal, round, and reactive to light.  Cardiovascular:     Rate and Rhythm: Normal rate and regular rhythm.     Heart sounds: Normal heart sounds. No murmur heard.    No friction rub.  Pulmonary:     Effort: Pulmonary effort is normal. No respiratory distress.     Breath sounds: No stridor. No wheezing.  Abdominal:     General: Bowel sounds are normal. There is no distension.     Palpations: Abdomen is soft. There is no mass.     Tenderness: There is no abdominal tenderness.  Musculoskeletal:        General: No deformity. Normal range of motion.     Cervical back: Normal range of motion and neck supple.  Lymphadenopathy:     Cervical: No cervical adenopathy.  Skin:    General: Skin is warm and dry.     Findings: No erythema or rash.  Neurological:     Mental Status: He is alert and oriented to person, place, and time. Mental status is at baseline.     Cranial Nerves: No cranial nerve deficit.     Coordination: Coordination normal.  Psychiatric:        Mood and Affect: Mood normal.    LABORATORY DATA:  I have reviewed the data as listed Lab Results  Component Value Date   WBC 10.9 (H) 10/13/2022   HGB 17.4 (H) 10/13/2022   HCT 50.1 10/13/2022   MCV 91.9 10/13/2022   PLT 256 10/13/2022   No results for input(s): "NA", "K", "CL", "CO2", "GLUCOSE", "BUN", "CREATININE", "CALCIUM", "GFRNONAA", "GFRAA", "PROT", "ALBUMIN", "AST", "ALT", "ALKPHOS", "BILITOT", "BILIDIR", "IBILI" in the last 8760 hours. Negative for, Bcr-Abl, Jak2 exon 14 with reflex to CARL/MPL/Exon 12.  Normal and erythropoietin level at 8.4 Negative flow cytometry.

## 2023-04-18 ENCOUNTER — Encounter: Payer: Self-pay | Admitting: Oncology

## 2023-04-18 ENCOUNTER — Inpatient Hospital Stay: Payer: 59 | Attending: Oncology

## 2023-04-18 ENCOUNTER — Inpatient Hospital Stay: Payer: 59

## 2023-04-18 ENCOUNTER — Inpatient Hospital Stay: Payer: 59 | Admitting: Oncology

## 2023-04-18 VITALS — BP 135/81 | HR 71 | Temp 98.0°F | Resp 18 | Wt 237.2 lb

## 2023-04-18 DIAGNOSIS — Z87891 Personal history of nicotine dependence: Secondary | ICD-10-CM | POA: Insufficient documentation

## 2023-04-18 DIAGNOSIS — G4730 Sleep apnea, unspecified: Secondary | ICD-10-CM | POA: Insufficient documentation

## 2023-04-18 DIAGNOSIS — D72829 Elevated white blood cell count, unspecified: Secondary | ICD-10-CM | POA: Insufficient documentation

## 2023-04-18 DIAGNOSIS — D751 Secondary polycythemia: Secondary | ICD-10-CM

## 2023-04-18 DIAGNOSIS — Z79899 Other long term (current) drug therapy: Secondary | ICD-10-CM | POA: Insufficient documentation

## 2023-04-18 DIAGNOSIS — K5090 Crohn's disease, unspecified, without complications: Secondary | ICD-10-CM | POA: Diagnosis not present

## 2023-04-18 DIAGNOSIS — Z79620 Long term (current) use of immunosuppressive biologic: Secondary | ICD-10-CM | POA: Diagnosis not present

## 2023-04-18 LAB — CBC WITH DIFFERENTIAL/PLATELET
Abs Immature Granulocytes: 0.04 10*3/uL (ref 0.00–0.07)
Basophils Absolute: 0.1 10*3/uL (ref 0.0–0.1)
Basophils Relative: 1 %
Eosinophils Absolute: 0.4 10*3/uL (ref 0.0–0.5)
Eosinophils Relative: 3 %
HCT: 49.7 % (ref 39.0–52.0)
Hemoglobin: 17.1 g/dL — ABNORMAL HIGH (ref 13.0–17.0)
Immature Granulocytes: 0 %
Lymphocytes Relative: 28 %
Lymphs Abs: 3.4 10*3/uL (ref 0.7–4.0)
MCH: 31.3 pg (ref 26.0–34.0)
MCHC: 34.4 g/dL (ref 30.0–36.0)
MCV: 90.9 fL (ref 80.0–100.0)
Monocytes Absolute: 1.1 10*3/uL — ABNORMAL HIGH (ref 0.1–1.0)
Monocytes Relative: 9 %
Neutro Abs: 7.3 10*3/uL (ref 1.7–7.7)
Neutrophils Relative %: 59 %
Platelets: 254 10*3/uL (ref 150–400)
RBC: 5.47 MIL/uL (ref 4.22–5.81)
RDW: 12.9 % (ref 11.5–15.5)
WBC: 12.2 10*3/uL — ABNORMAL HIGH (ref 4.0–10.5)
nRBC: 0 % (ref 0.0–0.2)

## 2023-04-18 NOTE — Assessment & Plan Note (Addendum)
Secondary erythrocytosis to smoking and sleep apnea. Labs reviewed and are discussed with patient. Hematocrit 49. No phlebotomy today, phlebotomy threshold hematocrit >=52 Given that he has stopped smoking, hematocrit is likely going to be stable.  He has a history of sleep apnea, he declined CPAP machine.  He does not need to follow-up routinely with hematology.  Recommend patient to continue follow-up with primary care provider.  He may reestablish care for phlebotomy if needed in the future.

## 2023-04-18 NOTE — Progress Notes (Signed)
Hematology/Oncology Progress note [phone] [phone]      Patient Care Team: Lynnea Ferrier, MD as PCP - General (Internal Medicine) Rickard Patience, MD as Consulting Physician (Oncology)  ASSESSMENT & PLAN:   Erythrocytosis Secondary erythrocytosis to smoking and sleep apnea. Labs reviewed and are discussed with patient. Hematocrit 49. No phlebotomy today, phlebotomy threshold hematocrit >=52 Given that he has stopped smoking, hematocrit is likely going to be stable.  He does not need to follow-up routinely with hematology.  Recommend patient to continue follow-up with primary care provider.  He may reestablish care for phlebotomy if needed in the future.  All questions were answered. Rickard Patience, MD, PhD [facility] Hematology Oncology 04/18/2023    REASON FOR VISIT Follow up for treatment of secondary polycythemia.   HISTORY OF PRESENTING ILLNESS:  Todd Shepherd 52 y.o.  male with PMH listed as below who was referred by gastroenterology to me for evaluation of persistent leukocytosis and elevated hemoglobin.  Extensive medical records from both [facility] and [place] were reviewed and HemOnc related problems and patient's complaints are listed as following  1 Crohn's disease: he has been on Humira 40mc  weekly and Budesonide 6mg  PO daily. His Surveillance colonoscopy was  done 07/02/17 and demonstrated an adenoma. Per GI, the TI contained some small ulcers without signs of bleeding and mucosa in between ulcers normal. The IC valve was open and not narrowed. It was noted that the exam was otherwise normal. Sveral biopsies were taken: the ileal ulcers were noted to have hte characteristics of healing mucosal injury. There was mild active chronic colitis at the ileocecal opening. The TI, cecum, ascending, tranverse, descending, sigmoid colon, and rectum did not have any colitis/pathologic change. There was a small lymphoid aggregate in the sigmoid  colon. There was no dysplasia/malignancy.  2 Persistent leukocytosis: wbc  was 10.8 on 07/12/2017, 12.8 on 07/23/2017 and 13.9 on 08/28/2017, ANC 8.98. Basophil 0.13,   3 Persistent elevated hemoglobin: Hb was 19.4, Hct 55.5 on 07/12/2017, Hb was 12.8 Hct  52.6 on 07/23/2017, Hb was 18.6 and Hct was 52.8 on 08/28/2017  4 Patient reports that he was seen by physician 10-15 years ago due to elevated blood counts and lab work up was done. He can not remember details of his diagnosis. He did not get any phlebotomy or medication for that condition. He has itchiness, which is chronic, intermittent, usually triggered after hot shower.  Denies any history of blood clot.     5 patient initially reported that he does not smoke cigarettes. After I got his preliminary lab work which showed high carbomonoxide level and I called him with further questioning he reports that he smokes cigar. He was advised to quit smoking cigars.  His home furnace was recently checked and there was no gas leak.  -Patient has not been started on sleep apnea treatment with CPAP.  Per note, he was advised to use CPAP and he declined.  # Crohn's disease, patient is on Humira subcutaneously and budesonide 3 mg p.o.  He follows up with gastroenterology  Interval history 52 y.o. male presented for follow-up for the management of erythrocytosis/polycythemia. Patient has completely stopped smoking for 2 to 3 months.  He reports feeling well.  No new complaints   ROS:  Review of Systems  Constitutional:  Negative for appetite change, chills, fatigue, fever and unexpected weight change.  HENT:   Negative for hearing loss, lump/mass and voice change.  Eyes:  Negative for eye problems and icterus.  Respiratory:  Negative for chest tightness, cough and shortness of breath.   Cardiovascular:  Negative for chest pain and leg swelling.  Gastrointestinal:  Negative for abdominal distention and abdominal pain.  Endocrine: Negative for hot flashes.   Genitourinary:  Negative for difficulty urinating, dysuria and frequency.   Musculoskeletal:  Negative for arthralgias, back pain and gait problem.  Skin:  Negative for itching and rash.  Neurological:  Negative for dizziness, gait problem, light-headedness and numbness.  Hematological:  Negative for adenopathy. Does not bruise/bleed easily.  Psychiatric/Behavioral:  Negative for confusion. The patient is not nervous/anxious.     MEDICAL HISTORY:  Past Medical History:  Diagnosis Date   Atypical nevus x 2 11/03/2016   Right Abd Sup-Moderate(punch), and Right Abd Inf-Mild   Benign hypertension    Crohn's disease ([facility])    GERD (gastroesophageal reflux disease)    Hyperlipidemia     SURGICAL HISTORY: Past Surgical History:  Procedure Laterality Date   COLONOSCOPY WITH PROPOFOL N/A 07/02/2017   Procedure: COLONOSCOPY WITH PROPOFOL;  Surgeon: Christena Deem, MD;  Location: [facility] ENDOSCOPY;  Service: Endoscopy;  Laterality: N/A;   colonoscopys     x6   ESOPHAGOGASTRODUODENOSCOPY     TONSILLECTOMY      SOCIAL HISTORY: Social History   Socioeconomic History   Marital status: Divorced    Spouse name: Not on file   Number of children: Not on file   Years of education: Not on file   Highest education level: Not on file  Occupational History   Not on file  Tobacco Use   Smoking status: Some Days    Types: Cigars   Smokeless tobacco: Never  Vaping Use   Vaping Use: Never used  Substance and Sexual Activity   Alcohol use: No   Drug use: No   Sexual activity: Not on file  Other Topics Concern   Not on file  Social History Narrative   Not on file   Social Determinants of Health   Financial Resource Strain: Not on file  Food Insecurity: Not on file  Transportation Needs: Not on file  Physical Activity: Not on file  Stress: Not on file  Social Connections: Not on file  Intimate Partner Violence: Not on file    FAMILY HISTORY: Family History  Problem Relation Age  of Onset   Diabetes Mother    Hypertension Father    Hypertension Maternal Grandmother    Diabetes Paternal Grandmother    Hypertension Paternal Grandmother    Anemia Paternal Grandfather     ALLERGIES:  is allergic to amoxicillin, levaquin [levofloxacin], and mercaptopurine.  MEDICATIONS:  Current Outpatient Medications  Medication Sig Dispense Refill   ACIDOPHILUS LACTOBACILLUS PO Take by mouth.      Adalimumab 40 MG/0.8ML PNKT Inject into the skin once a week.      Ascorbic Acid (VITAMIN C) 1000 MG tablet Take 1,000 mg by mouth daily.     bisoprolol (ZEBETA) 5 MG tablet Take 5 mg by mouth daily.     budesonide (ENTOCORT EC) 3 MG 24 hr capsule Take 2 mg daily by mouth.      CVS ADVANCED GLUCOSE TEST test strip TEST ONCE A DAY     CVS Lancets Micro Thin 33G MISC Apply topically daily.     Lancet Devices (TRUEDRAW LANCING DEVICE) MISC daily.     Multiple Vitamin (MULTIVITAMIN) tablet Take 1 tablet by mouth daily.     Omega-3 Fatty  Acids (OMEGA-3 FISH OIL PO) Take by mouth.     pantoprazole (PROTONIX) 40 MG tablet Take 40 mg by mouth daily.     Probiotic Product (PROBIOTIC-10 PO) Take by mouth.     rosuvastatin (CRESTOR) 10 MG tablet Take by mouth.     RYBELSUS 14 MG TABS Take 1 tablet by mouth daily.     zinc gluconate 50 MG tablet Take 50 mg by mouth daily.     No current facility-administered medications for this visit.      [name]  PHYSICAL EXAMINATION: ECOG PERFORMANCE STATUS: 0 - Asymptomatic Vitals:   04/18/23 1308  BP: 135/81  Pulse: 71  Resp: 18  Temp: 98 F (36.7 C)  SpO2: 99%   Filed Weights   04/18/23 1308  Weight: 237 lb 3.2 oz (107.6 kg)   Physical Exam Constitutional:      General: He is not in acute distress. HENT:     Head: Normocephalic and atraumatic.     Mouth/Throat:     Pharynx: No oropharyngeal exudate.  Eyes:     General: No scleral icterus. Cardiovascular:     Rate and Rhythm: Normal rate.  Pulmonary:     Effort: Pulmonary effort is  normal. No respiratory distress.  Abdominal:     General: There is no distension.     Palpations: There is no mass.  Musculoskeletal:        General: No deformity.     Cervical back: Normal range of motion and neck supple.  Skin:    Findings: No erythema or rash.  Neurological:     Mental Status: He is alert and oriented to person, place, and time. Mental status is at baseline.  Psychiatric:        Mood and Affect: Mood normal.    LABORATORY DATA:  I have reviewed the data as listed Lab Results  Component Value Date   WBC 12.2 (H) 04/18/2023   HGB 17.1 (H) 04/18/2023   HCT 49.7 04/18/2023   MCV 90.9 04/18/2023   PLT 254 04/18/2023   No results for input(s): "NA", "K", "CL", "CO2", "GLUCOSE", "BUN", "CREATININE", "CALCIUM", "GFRNONAA", "GFRAA", "PROT", "ALBUMIN", "AST", "ALT", "ALKPHOS", "BILITOT", "BILIDIR", "IBILI" in the last 8760 hours. Negative for, Bcr-Abl, Jak2 exon 14 with reflex to CARL/MPL/Exon 12.  Normal and erythropoietin level at 8.4 Negative flow cytometry.

## 2023-08-17 ENCOUNTER — Encounter: Payer: Self-pay | Admitting: Oncology

## 2023-08-17 ENCOUNTER — Ambulatory Visit: Payer: 59

## 2023-08-17 DIAGNOSIS — K5289 Other specified noninfective gastroenteritis and colitis: Secondary | ICD-10-CM | POA: Diagnosis present

## 2023-08-17 DIAGNOSIS — K5730 Diverticulosis of large intestine without perforation or abscess without bleeding: Secondary | ICD-10-CM | POA: Diagnosis not present
